# Patient Record
Sex: Female | Born: 1950 | Race: White | Hispanic: No | Smoking: Never smoker
Health system: Southern US, Community
[De-identification: ages and names within clinical notes are randomized; demographics above are authoritative.]

## PROBLEM LIST (undated history)

## (undated) DIAGNOSIS — M1990 Unspecified osteoarthritis, unspecified site: Secondary | ICD-10-CM

## (undated) DIAGNOSIS — E785 Hyperlipidemia, unspecified: Secondary | ICD-10-CM

## (undated) DIAGNOSIS — I10 Essential (primary) hypertension: Secondary | ICD-10-CM

## (undated) DIAGNOSIS — G8929 Other chronic pain: Secondary | ICD-10-CM

## (undated) DIAGNOSIS — K219 Gastro-esophageal reflux disease without esophagitis: Secondary | ICD-10-CM

## (undated) DIAGNOSIS — I219 Acute myocardial infarction, unspecified: Secondary | ICD-10-CM

## (undated) DIAGNOSIS — M519 Unspecified thoracic, thoracolumbar and lumbosacral intervertebral disc disorder: Secondary | ICD-10-CM

## (undated) HISTORY — PX: CARPAL TUNNEL RELEASE: SHX101

## (undated) HISTORY — DX: Hyperlipidemia, unspecified: E78.5

## (undated) HISTORY — PX: ABDOMINAL HYSTERECTOMY: SHX81

## (undated) HISTORY — PX: SHOULDER SURGERY: SHX246

## (undated) HISTORY — PX: ELBOW ARTHROPLASTY: SHX928

## (undated) HISTORY — PX: OTHER SURGICAL HISTORY: SHX169

---

## 2002-04-15 ENCOUNTER — Ambulatory Visit (HOSPITAL_BASED_OUTPATIENT_CLINIC_OR_DEPARTMENT_OTHER): Admission: RE | Admit: 2002-04-15 | Discharge: 2002-04-15 | Payer: Self-pay | Admitting: *Deleted

## 2014-06-29 DIAGNOSIS — K219 Gastro-esophageal reflux disease without esophagitis: Secondary | ICD-10-CM | POA: Insufficient documentation

## 2014-06-29 DIAGNOSIS — M419 Scoliosis, unspecified: Secondary | ICD-10-CM | POA: Insufficient documentation

## 2014-07-15 ENCOUNTER — Ambulatory Visit: Payer: Self-pay | Admitting: Neurology

## 2015-10-30 DIAGNOSIS — G894 Chronic pain syndrome: Secondary | ICD-10-CM | POA: Insufficient documentation

## 2015-10-30 DIAGNOSIS — J189 Pneumonia, unspecified organism: Secondary | ICD-10-CM | POA: Insufficient documentation

## 2015-10-30 DIAGNOSIS — I219 Acute myocardial infarction, unspecified: Secondary | ICD-10-CM | POA: Insufficient documentation

## 2015-11-10 ENCOUNTER — Encounter (HOSPITAL_COMMUNITY): Payer: Self-pay | Admitting: Emergency Medicine

## 2015-11-10 ENCOUNTER — Emergency Department (HOSPITAL_COMMUNITY)
Admission: EM | Admit: 2015-11-10 | Discharge: 2015-11-10 | Disposition: A | Discharge status: Home / Self Care | Payer: Medicare HMO | Attending: Emergency Medicine | Admitting: Emergency Medicine

## 2015-11-10 ENCOUNTER — Emergency Department (HOSPITAL_COMMUNITY): Payer: Medicare HMO

## 2015-11-10 DIAGNOSIS — G8929 Other chronic pain: Secondary | ICD-10-CM | POA: Insufficient documentation

## 2015-11-10 DIAGNOSIS — R350 Frequency of micturition: Secondary | ICD-10-CM | POA: Insufficient documentation

## 2015-11-10 DIAGNOSIS — Z79899 Other long term (current) drug therapy: Secondary | ICD-10-CM | POA: Diagnosis not present

## 2015-11-10 DIAGNOSIS — R1084 Generalized abdominal pain: Secondary | ICD-10-CM | POA: Diagnosis not present

## 2015-11-10 DIAGNOSIS — Z87448 Personal history of other diseases of urinary system: Secondary | ICD-10-CM | POA: Diagnosis not present

## 2015-11-10 DIAGNOSIS — R197 Diarrhea, unspecified: Secondary | ICD-10-CM

## 2015-11-10 DIAGNOSIS — I10 Essential (primary) hypertension: Secondary | ICD-10-CM | POA: Diagnosis not present

## 2015-11-10 DIAGNOSIS — Z8701 Personal history of pneumonia (recurrent): Secondary | ICD-10-CM | POA: Diagnosis not present

## 2015-11-10 DIAGNOSIS — Z792 Long term (current) use of antibiotics: Secondary | ICD-10-CM | POA: Diagnosis not present

## 2015-11-10 DIAGNOSIS — I252 Old myocardial infarction: Secondary | ICD-10-CM | POA: Diagnosis not present

## 2015-11-10 DIAGNOSIS — R1030 Lower abdominal pain, unspecified: Secondary | ICD-10-CM

## 2015-11-10 DIAGNOSIS — E876 Hypokalemia: Secondary | ICD-10-CM | POA: Insufficient documentation

## 2015-11-10 HISTORY — DX: Other chronic pain: G89.29

## 2015-11-10 HISTORY — DX: Acute myocardial infarction, unspecified: I21.9

## 2015-11-10 HISTORY — DX: Essential (primary) hypertension: I10

## 2015-11-10 LAB — URINALYSIS, ROUTINE W REFLEX MICROSCOPIC
BILIRUBIN URINE: NEGATIVE
GLUCOSE, UA: NEGATIVE mg/dL
HGB URINE DIPSTICK: NEGATIVE
Ketones, ur: NEGATIVE mg/dL
Leukocytes, UA: NEGATIVE
Nitrite: NEGATIVE
Protein, ur: NEGATIVE mg/dL
SPECIFIC GRAVITY, URINE: 1.025 (ref 1.005–1.030)
pH: 6.5 (ref 5.0–8.0)

## 2015-11-10 LAB — CBC WITH DIFFERENTIAL/PLATELET
BASOS ABS: 0 10*3/uL (ref 0.0–0.1)
BASOS PCT: 0 %
EOS ABS: 0.3 10*3/uL (ref 0.0–0.7)
EOS PCT: 3 %
HCT: 43.7 % (ref 36.0–46.0)
Hemoglobin: 14.4 g/dL (ref 12.0–15.0)
Lymphocytes Relative: 13 %
Lymphs Abs: 1.2 10*3/uL (ref 0.7–4.0)
MCH: 31.1 pg (ref 26.0–34.0)
MCHC: 33 g/dL (ref 30.0–36.0)
MCV: 94.4 fL (ref 78.0–100.0)
MONO ABS: 0.7 10*3/uL (ref 0.1–1.0)
Monocytes Relative: 7 %
NEUTROS ABS: 7.2 10*3/uL (ref 1.7–7.7)
Neutrophils Relative %: 77 %
Platelets: 349 10*3/uL (ref 150–400)
RBC: 4.63 MIL/uL (ref 3.87–5.11)
RDW: 14.3 % (ref 11.5–15.5)
WBC: 9.4 10*3/uL (ref 4.0–10.5)

## 2015-11-10 LAB — COMPREHENSIVE METABOLIC PANEL
ALBUMIN: 4 g/dL (ref 3.5–5.0)
ALK PHOS: 55 U/L (ref 38–126)
ALT: 80 U/L — ABNORMAL HIGH (ref 14–54)
AST: 71 U/L — ABNORMAL HIGH (ref 15–41)
Anion gap: 10 (ref 5–15)
BILIRUBIN TOTAL: 0.7 mg/dL (ref 0.3–1.2)
BUN: 16 mg/dL (ref 6–20)
CALCIUM: 9 mg/dL (ref 8.9–10.3)
CO2: 25 mmol/L (ref 22–32)
CREATININE: 0.73 mg/dL (ref 0.44–1.00)
Chloride: 102 mmol/L (ref 101–111)
GFR calc Af Amer: >60 mL/min (ref >60)
GFR calc non Af Amer: >60 mL/min (ref >60)
GLUCOSE: 117 mg/dL — AB (ref 65–99)
Potassium: 3 mmol/L — ABNORMAL LOW (ref 3.5–5.1)
Sodium: 137 mmol/L (ref 135–145)
TOTAL PROTEIN: 7 g/dL (ref 6.5–8.1)

## 2015-11-10 MED ORDER — IOHEXOL 300 MG/ML  SOLN
100.0000 mL | Freq: Once | INTRAMUSCULAR | Status: AC | PRN
  Administered 2015-11-10: 100 mL via INTRAVENOUS

## 2015-11-10 MED ORDER — SODIUM CHLORIDE 0.9 % IV BOLUS (SEPSIS)
1000.0000 mL | Freq: Once | INTRAVENOUS | Status: AC
  Administered 2015-11-10: 1000 mL via INTRAVENOUS

## 2015-11-10 MED ORDER — POTASSIUM CHLORIDE CRYS ER 20 MEQ PO TBCR
40.0000 meq | EXTENDED_RELEASE_TABLET | Freq: Once | ORAL | Status: AC
  Administered 2015-11-10: 40 meq via ORAL
  Filled 2015-11-10: qty 2

## 2015-11-10 MED ORDER — BARIUM SULFATE 2.1 % PO SUSP
ORAL | Status: DC
Start: 2015-11-10 — End: 2015-11-10
  Filled 2015-11-10: qty 1

## 2015-11-10 MED ORDER — DICYCLOMINE HCL 10 MG/ML IM SOLN
20.0000 mg | Freq: Once | INTRAMUSCULAR | Status: AC
  Administered 2015-11-10: 20 mg via INTRAMUSCULAR
  Filled 2015-11-10: qty 2

## 2015-11-10 MED ORDER — POTASSIUM CHLORIDE CRYS ER 20 MEQ PO TBCR: 20.0000 meq | EXTENDED_RELEASE_TABLET | Freq: Two times a day (BID) | ORAL | Status: DC

## 2015-11-10 NOTE — ED Notes (Signed)
Pt c/o diarrhea for the last 4 days, she states she is currently taking flagyl.

## 2015-11-10 NOTE — ED Notes (Signed)
Patient ambulatory to restroom to collect urine sample 

## 2015-11-10 NOTE — Discharge Instructions (Signed)
Drink plenty of fluids. Continue the bentyl for your abdominal pain 4 times a day if needed. Use the imodium for your diarrhea. Take the potassium pills until gone. Eat Yogurt to help resolve your diarrhea. Follow up with your doctor if you aren't improving in the next several days.   Return to the ED if you get a fever or feel you are getting dehydrated.

## 2015-11-10 NOTE — ED Notes (Signed)
Patient ambulated to restroom with staff assist, tolerated well.

## 2015-11-10 NOTE — ED Provider Notes (Signed)
CSN: [id number]     Arrival date & time 11/10/15  0155 History   First MD Initiated Contact with Patient 11/10/15 0207   Chief Complaint  Patient presents with  . Diarrhea     (Consider location/radiation/quality/duration/timing/severity/associated sxs/prior Treatment) HPI patient reports she remembers eating dinner Saturday night, November 5. She reports her brother found her unresponsive the following day. She was admitted to [facility] with fever to 105. She states she had lumbar puncture done twice to look for meningitis and she was diagnosed with an MI. She states she was transferred to [facility] where she was admitted for over a week. She reports she started having diarrhea in the hospital and reports she was discharged about 3 days ago. When asked how often she is having diarrhea she states "every little bit". When asked how may times a day that is she states 3-5. She states her stools are loose and watery. She reports some lower abdominal pain that has been constant. She states she was taking some diarrhea medication which she cannot name which helped however she states it restarted again tonight. She states she was feeling better when she was discharged from the hospital. She denies having any chest pain. She denies feeling dizzy or lightheaded. She also states the did not return her myrebetriq for her OAB when she was discharged from the hospital and she is having urinary frequency.    PCP Dr Sharee Pimple in [place]  Past Medical History  Diagnosis Date  . MI (myocardial infarction) ([place])   . Hypertension   . Chronic pain   OAB  Past Surgical History  Procedure Laterality Date  . Knee replacements  bilateral    . Shoulder surgery  Bilateral rotator cuff    . Carpal tunnel release left    hysterectomy, oopherectomy Left elbow tendonitis surgery  History reviewed. No pertinent family history. Social History  Substance Use Topics  . Smoking status: Never Smoker   .  Smokeless tobacco: None  . Alcohol Use: No   Lives at home Lives with brother  OB History    No data available     Review of Systems  All other systems reviewed and are negative.     Allergies  Codeine and Nsaids  Home Medications   Prior to Admission medications   Medication Sig Start Date End Date Taking? Authorizing Provider  acetaminophen (TYLENOL) 325 MG tablet Take 650 mg by mouth every 4 (four) hours as needed.   Yes Historical Provider, MD  cetirizine (ZYRTEC) 10 MG tablet Take 10 mg by mouth daily.   Yes Historical Provider, MD  diazepam (VALIUM) 2 MG tablet Take 2 mg by mouth every 6 (six) hours as needed for anxiety.   Yes Historical Provider, MD  dicyclomine (BENTYL) 10 MG capsule Take 10 mg by mouth 4 (four) times daily -  before meals and at bedtime.   Yes Historical Provider, MD  esomeprazole (NEXIUM) 40 MG capsule Take 40 mg by mouth daily at 12 noon.   Yes Historical Provider, MD  fentaNYL (DURAGESIC - DOSED MCG/HR) 50 MCG/HR Place 50 mcg onto the skin every 3 (three) days.   Yes Historical Provider, MD  gabapentin (NEURONTIN) 100 MG capsule Take 100 mg by mouth 3 (three) times daily.   Yes Historical Provider, MD  HYDROmorphone (DILAUDID) 2 MG tablet Take by mouth every 6 (six) hours as needed for severe pain.   Yes Historical Provider, MD  isosorbide dinitrate (ISORDIL) 10 MG tablet Take 10  mg by mouth 3 (three) times daily.   Yes Historical Provider, MD  levofloxacin (LEVAQUIN) 750 MG tablet Take 750 mg by mouth daily.   Yes Historical Provider, MD  lisinopril (PRINIVIL,ZESTRIL) 10 MG tablet Take 10 mg by mouth daily.   Yes Historical Provider, MD  loperamide (IMODIUM) 2 MG capsule Take 2 mg by mouth as needed for diarrhea or loose stools.   Yes Historical Provider, MD  metoprolol (LOPRESSOR) 50 MG tablet Take 50 mg by mouth 2 (two) times daily.   Yes Historical Provider, MD  metroNIDAZOLE (FLAGYL) 500 MG tablet Take 500 mg by mouth 3 (three) times daily.   Yes  Historical Provider, MD  modafinil (PROVIGIL) 100 MG tablet Take 100 mg by mouth daily.   Yes Historical Provider, MD  rosuvastatin (CRESTOR) 5 MG tablet Take 5 mg by mouth daily.   Yes Historical Provider, MD  sertraline (ZOLOFT) 50 MG tablet Take 50 mg by mouth daily.   Yes Historical Provider, MD  potassium chloride SA (K-DUR,KLOR-CON) 20 MEQ tablet Take 1 tablet (20 mEq total) by mouth 2 (two) times daily. 11/10/15   Rolland Porter, MD   BP 154/84 mmHg  Pulse 74  Temp(Src) 98.2 F (36.8 C) (Oral)  Resp 24  Ht 5' 6.5" (1.689 m)  Wt 250 lb (113.399 kg)  BMI 39.75 kg/m2  SpO2 94%  Vital signs normal   Physical Exam  Constitutional: She is oriented to person, place, and time. She appears well-developed and well-nourished.  Non-toxic appearance. She does not appear ill. No distress.  HENT:  Head: Normocephalic and atraumatic.  Right Ear: External ear normal.  Left Ear: External ear normal.  Nose: Nose normal. No mucosal edema or rhinorrhea.  Mouth/Throat: Oropharynx is clear and moist and mucous membranes are normal. No dental abscesses or uvula swelling.  Eyes: Conjunctivae and EOM are normal. Pupils are equal, round, and reactive to light.  Neck: Normal range of motion and full passive range of motion without pain. Neck supple.  Cardiovascular: Normal rate, regular rhythm and normal heart sounds.  Exam reveals no gallop and no friction rub.   No murmur heard. Pulmonary/Chest: Effort normal and breath sounds normal. No respiratory distress. She has no wheezes. She has no rhonchi. She has no rales. She exhibits no tenderness and no crepitus.  Abdominal: Soft. Normal appearance and bowel sounds are normal. She exhibits no distension. There is no tenderness. There is no rebound and no guarding.    Mild diffuse lower abdominal pain  Musculoskeletal: Normal range of motion. She exhibits no edema or tenderness.  Moves all extremities well.   Neurological: She is alert and oriented to  person, place, and time. She has normal strength. No cranial nerve deficit.  Skin: Skin is warm, dry and intact. No rash noted. No erythema. There is pallor.  Psychiatric: Her behavior is normal. Her speech is delayed.  Flat affect, poor eye contact  Nursing note and vitals reviewed.   ED Course  Procedures (including critical care time)  Medications  Barium Sulfate 2.1 % SUSP (not administered)  sodium chloride 0.9 % bolus 1,000 mL (0 mLs Intravenous Stopped 11/10/15 0356)  iohexol (OMNIPAQUE) 300 MG/ML solution 100 mL (100 mLs Intravenous Contrast Given 11/10/15 0336)  potassium chloride SA (K-DUR,KLOR-CON) CR tablet 40 mEq (40 mEq Oral Given 11/10/15 0355)  dicyclomine (BENTYL) injection 20 mg (20 mg Intramuscular Given 11/10/15 0419)   Patient was given IV fluids. CT scan was done to look for colitis or other intestinal  abnormalities that could cause diarrhea. After reviewing her laboratory results she was given oral potassium which she tolerated well. Nurse reports patients had no diarrhea since she's been in the ED.  At 4 AM I talked to the patient. I gave her her test results. She is disappointed she's not going to be admitted. She is asking me for sleeping pill. I asked her why she needs a sleeping pill and she states she can't sleep because of the diarrhea and the abdominal pain. She was given Bentyl IM and I discussed with her did not want to give her sleeping pill and have her be incontinent in her sleep from diarrhea or from urinary frequency.  Patient CT scan shows some inflammatory changes in her right lung which is consistent with her history of pneumonia. Patient is still on Levaquin for that.  04:47 Pt states the bentyl has helped her pain. I went over her medications with her daughter who is now taking care of the patient's medications. She is going to make sure she takes the bentyl and imodium as needed for pain or diarrhea. We discussed some of this may be from narcotic  withdrawal since her narcotics have been reduced since she left the hospital and they acknowledge they are aware of that.  We also discussed taking the potassium pills. Daughter states in the hospital she had had a low potassium that was resolved with IV potassium at time of discharge it was cut to normal again. We discussed it is probably low again because of the persistent diarrhea.   I reviewed patient's [facility] records through care everywhere. According to their notes they surmise patient had taken too much of her pain medication and was found unresponsive. She was admitted to [facility] with sepsis and pneumonia felt to be aspiration pneumonia. She was on a ventilator and was transferred to [facility]. She was extubated successfully there. She had a NSTEMI.  She did report diarrhea in the hospital. Her C. difficile test was negative. She had a normal cardiac catheterization. She had an echocardiogram which showed mild left ventricular systolic dysfunction with mild LVH, normal right ventricular systolic function, trivial regurgitation noted, no valvular stenosis. She had Doppler ultrasounds done of her legs which showed no DVT. Her Blood cultures at [facility] were negative. Her home pain medications were decreased at time of discharge.  Labs Review Results for orders placed or performed during the hospital encounter of 11/10/15  Comprehensive metabolic panel  Result Value Ref Range   Sodium 137 135 - 145 mmol/L   Potassium 3.0 (L) 3.5 - 5.1 mmol/L   Chloride 102 101 - 111 mmol/L   CO2 25 22 - 32 mmol/L   Glucose, Bld 117 (H) 65 - 99 mg/dL   BUN 16 6 - 20 mg/dL   Creatinine, Ser 0.73 0.44 - 1.00 mg/dL   Calcium 9.0 8.9 - 10.3 mg/dL   Total Protein 7.0 6.5 - 8.1 g/dL   Albumin 4.0 3.5 - 5.0 g/dL   AST 71 (H) 15 - 41 U/L   ALT 80 (H) 14 - 54 U/L   Alkaline Phosphatase 55 38 - 126 U/L   Total Bilirubin 0.7 0.3 - 1.2 mg/dL   GFR calc non Af Amer >60 >60 mL/min   GFR calc Af Amer >60 >60  mL/min   Anion gap 10 5 - 15  CBC with Differential  Result Value Ref Range   WBC 9.4 4.0 - 10.5 K/uL   RBC 4.63 3.87 -  5.11 MIL/uL   Hemoglobin 14.4 12.0 - 15.0 g/dL   HCT 43.7 36.0 - 46.0 %   MCV 94.4 78.0 - 100.0 fL   MCH 31.1 26.0 - 34.0 pg   MCHC 33.0 30.0 - 36.0 g/dL   RDW 14.3 11.5 - 15.5 %   Platelets 349 150 - 400 K/uL   Neutrophils Relative % 77 %   Neutro Abs 7.2 1.7 - 7.7 K/uL   Lymphocytes Relative 13 %   Lymphs Abs 1.2 0.7 - 4.0 K/uL   Monocytes Relative 7 %   Monocytes Absolute 0.7 0.1 - 1.0 K/uL   Eosinophils Relative 3 %   Eosinophils Absolute 0.3 0.0 - 0.7 K/uL   Basophils Relative 0 %   Basophils Absolute 0.0 0.0 - 0.1 K/uL  Urinalysis, Routine w reflex microscopic  Result Value Ref Range   Color, Urine AMBER (A) YELLOW   APPearance CLEAR CLEAR   Specific Gravity, Urine 1.025 1.005 - 1.030   pH 6.5 5.0 - 8.0   Glucose, UA NEGATIVE NEGATIVE mg/dL   Hgb urine dipstick NEGATIVE NEGATIVE   Bilirubin Urine NEGATIVE NEGATIVE   Ketones, ur NEGATIVE NEGATIVE mg/dL   Protein, ur NEGATIVE NEGATIVE mg/dL   Nitrite NEGATIVE NEGATIVE   Leukocytes, UA NEGATIVE NEGATIVE    Laboratory interpretation all normal except hypokalemia     Imaging Review Ct Abdomen Pelvis W Contrast  11/10/2015  CLINICAL DATA:  Lower abdominal pain, diarrhea for 4 days. On several antibiotics. History of hypertension and chronic pain. EXAM: CT ABDOMEN AND PELVIS WITH CONTRAST TECHNIQUE: Multidetector CT imaging of the abdomen and pelvis was performed using the standard protocol following bolus administration of intravenous contrast. CONTRAST:  146mL OMNIPAQUE IOHEXOL 300 MG/ML  SOLN COMPARISON:  Abdominal ultrasound May 08, 2012 FINDINGS: LUNG BASES: RIGHT lower lobe strandy densities, a 10 mm nodular density, axial 4/31. Mild dependent atelectasis LEFT lung. Included heart size is normal. No pericardial effusions. SOLID ORGANS: The liver is diffusely hypodense compatible with steatosis.  Subcentimeter probable cyst versus lymphangioma in the spleen. Gallbladder, pancreas and adrenal glands are unremarkable. GASTROINTESTINAL TRACT: The stomach, small and large bowel are normal in course and caliber without inflammatory changes. The appendix is not discretely identified, however there are no inflammatory changes in the right lower quadrant. KIDNEYS/ URINARY TRACT: Kidneys are orthotopic, demonstrating symmetric enhancement. No nephrolithiasis, hydronephrosis or solid renal masses. Bilateral extrarenal pelvises. 22 mm cyst RIGHT interpolar kidney. The unopacified ureters are normal in course and caliber. Delayed imaging through the kidneys demonstrates symmetric prompt contrast excretion within the proximal urinary collecting system. Urinary bladder is partially distended and unremarkable. PERITONEUM/RETROPERITONEUM: Aortoiliac vessels are normal in course and caliber, mild calcific atherosclerosis. No lymphadenopathy by CT size criteria. Status post hysterectomy. No intraperitoneal free fluid nor free air. SOFT TISSUE/OSSEOUS STRUCTURES: Non-suspicious. Mild nonspecific fat stranding RIGHT inguinal canal. Broad lumbar levoscoliosis. Degenerative change lumbar spine resulting in severe LEFT L5-S1 neural foraminal narrowing. Severe RIGHT L4-5 neural foraminal narrowing. Anterior abdominal wall scarring. IMPRESSION: No acute intra-abdominal or pelvic process. 10 mm nodular density RIGHT lower lobe associated strandy densities may be inflammatory though, recommend follow-up CT in 3 months or PET/CT or Biopsy for further evaluation. This recommendation follows the consensus statement: "Guidelines for Management of Small Pulmonary Nodules Detected on CT Scans: A Statement from the [place]" as published in Radiology 2005; 237:395-400. Hepatic steatosis. Electronically Signed   By: Elon Alas M.D.   On: 11/10/2015 04:00   I have personally reviewed and evaluated  these images and lab  results as part of my medical decision-making.   EKG Interpretation None      MDM   Final diagnoses:  Diarrhea, unspecified type  Abdominal pain, lower  Hypokalemia    New Prescriptions   POTASSIUM CHLORIDE SA (K-DUR,KLOR-CON) 20 MEQ TABLET    Take 1 tablet (20 mEq total) by mouth 2 (two) times daily.    Plan discharge  Rolland Porter, MD, Barbette Or, MD 11/10/15 [id number]

## 2015-11-10 NOTE — ED Notes (Signed)
Patient verbalizes understanding of discharge instructions, prescription medications, home care and follow up care. Patient out of department at this time with family. 

## 2015-11-10 NOTE — ED Notes (Signed)
Patient can't tolerate contrast after taking in half the bottle. Sick on stomach.

## 2015-11-13 DIAGNOSIS — N3281 Overactive bladder: Secondary | ICD-10-CM | POA: Insufficient documentation

## 2015-11-13 DIAGNOSIS — F32A Depression, unspecified: Secondary | ICD-10-CM | POA: Insufficient documentation

## 2015-11-13 DIAGNOSIS — R197 Diarrhea, unspecified: Secondary | ICD-10-CM | POA: Insufficient documentation

## 2015-11-13 DIAGNOSIS — F329 Major depressive disorder, single episode, unspecified: Secondary | ICD-10-CM | POA: Insufficient documentation

## 2015-11-13 DIAGNOSIS — I10 Essential (primary) hypertension: Secondary | ICD-10-CM | POA: Insufficient documentation

## 2015-11-13 DIAGNOSIS — R1084 Generalized abdominal pain: Secondary | ICD-10-CM | POA: Insufficient documentation

## 2015-11-18 DIAGNOSIS — R911 Solitary pulmonary nodule: Secondary | ICD-10-CM | POA: Insufficient documentation

## 2015-12-12 ENCOUNTER — Telehealth: Payer: Self-pay | Admitting: Gastroenterology

## 2015-12-12 ENCOUNTER — Encounter: Payer: Self-pay | Admitting: Gastroenterology

## 2015-12-12 ENCOUNTER — Ambulatory Visit (INDEPENDENT_AMBULATORY_CARE_PROVIDER_SITE_OTHER): Payer: Medicare HMO | Admitting: Gastroenterology

## 2015-12-12 VITALS — BP 179/98 | HR 68 | Temp 99.0°F | Ht 66.5 in | Wt 211.0 lb

## 2015-12-12 DIAGNOSIS — K5900 Constipation, unspecified: Secondary | ICD-10-CM

## 2015-12-12 DIAGNOSIS — G5600 Carpal tunnel syndrome, unspecified upper limb: Secondary | ICD-10-CM | POA: Insufficient documentation

## 2015-12-12 DIAGNOSIS — R7303 Prediabetes: Secondary | ICD-10-CM | POA: Insufficient documentation

## 2015-12-12 DIAGNOSIS — M1990 Unspecified osteoarthritis, unspecified site: Secondary | ICD-10-CM | POA: Insufficient documentation

## 2015-12-12 NOTE — Telephone Encounter (Signed)
Pt has scheduled an appt for today (12/12/15) due to a cancellation.

## 2015-12-12 NOTE — Telephone Encounter (Signed)
Patients daughter called, Amy, to look for a cancellation for her mother. Her current appointment is 12/29. She stated her mother has not had a bowel movement in 2 weeks and is hurting. Please call her and advise. Cell  [phone] or home [phone]

## 2015-12-12 NOTE — Progress Notes (Signed)
Gastroenterology Consultation  Referring Provider:     Vesta Mixer Primary Care Physician:  Vesta Mixer Primary Gastroenterologist:  Dr. Allen Norris     Reason for Consultation:     Constipation        HPI:   Tina Morgan is a 64 y.o. y/o female referred for consultation & management of constipation by Dr. Sharee Pimple, Lennon Alstrom, PA-C.  This patient comes today with consultation. The patient states she is been constant for some time. The patient is also on pain killers at the present time. The patient was seen in the hospital for lower abdominal pain and previous to that she was seen for loss of consciousness and was found to have an MI. The patient states she takes MiraLAX intermittently and takes a laxative every day. There is no report of any unexplained weight loss fevers chills nausea vomiting black stools or bloody stools. The patient states that her abdominal pain is in the lower abdomen and results in her having to double over in pain. There is no report of this pain happening prior to her previous episodes. The patient reports that she had a colonoscopy many years ago but nothing since.  Past Medical History  Diagnosis Date  . MI (myocardial infarction) ([place])   . Hypertension   . Chronic pain   . Hyperlipidemia     Past Surgical History  Procedure Laterality Date  . Knee replacements    . Shoulder surgery    . Carpal tunnel release      Prior to Admission medications   Medication Sig Start Date End Date Taking? Authorizing Provider  acetaminophen (TYLENOL) 325 MG tablet Take 650 mg by mouth every 4 (four) hours as needed.   Yes Historical Provider, MD  cetirizine (ZYRTEC) 10 MG tablet Take 10 mg by mouth daily.   Yes Historical Provider, MD  DULoxetine (CYMBALTA) 60 MG capsule Take by mouth. 11/18/15 11/17/16 Yes Historical Provider, MD  esomeprazole (NEXIUM) 40 MG capsule Take 40 mg by mouth daily at 12 noon.   Yes Historical Provider, MD  fentaNYL  (DURAGESIC - DOSED MCG/HR) 100 MCG/HR  09/23/15  Yes Historical Provider, MD  fentaNYL (DURAGESIC - DOSED MCG/HR) 50 MCG/HR Place 50 mcg onto the skin every 3 (three) days.   Yes Historical Provider, MD  gabapentin (NEURONTIN) 100 MG capsule Take 100 mg by mouth 3 (three) times daily.   Yes Historical Provider, MD  HYDROmorphone (DILAUDID) 2 MG tablet Take by mouth every 6 (six) hours as needed for severe pain.   Yes Historical Provider, MD  HYDROmorphone (DILAUDID) 4 MG tablet  11/30/15  Yes Historical Provider, MD  isosorbide dinitrate (ISORDIL) 10 MG tablet Take 10 mg by mouth 3 (three) times daily.   Yes Historical Provider, MD  lisinopril (PRINIVIL,ZESTRIL) 10 MG tablet Take 10 mg by mouth daily.   Yes Historical Provider, MD  metoprolol (LOPRESSOR) 50 MG tablet Take 50 mg by mouth 2 (two) times daily.   Yes Historical Provider, MD  mirabegron ER (MYRBETRIQ) 50 MG TB24 tablet Take by mouth.   Yes Historical Provider, MD  rosuvastatin (CRESTOR) 5 MG tablet Take 5 mg by mouth daily.   Yes Historical Provider, MD  cloNIDine (CATAPRES) 0.2 MG tablet Reported on 12/12/2015 10/17/15   Historical Provider, MD  diazepam (VALIUM) 2 MG tablet Take 2 mg by mouth every 6 (six) hours as needed for anxiety. Reported on 12/12/2015    Historical Provider, MD  diazepam (VALIUM)  5 MG tablet Reported on 12/12/2015 10/21/15   Historical Provider, MD  dicyclomine (BENTYL) 10 MG capsule Take 10 mg by mouth 4 (four) times daily -  before meals and at bedtime. Reported on 12/12/2015    Historical Provider, MD  gabapentin (NEURONTIN) 600 MG tablet Reported on 12/12/2015 10/17/15   Historical Provider, MD  levofloxacin (LEVAQUIN) 750 MG tablet Take 750 mg by mouth daily. Reported on 12/12/2015    Historical Provider, MD  loperamide (IMODIUM) 2 MG capsule Take 2 mg by mouth as needed for diarrhea or loose stools. Reported on 12/12/2015    Historical Provider, MD  metroNIDAZOLE (FLAGYL) 500 MG tablet Take 500 mg by mouth 3  (three) times daily. Reported on 12/12/2015    Historical Provider, MD  modafinil (PROVIGIL) 100 MG tablet Take 100 mg by mouth daily. Reported on 12/12/2015    Historical Provider, MD  potassium chloride SA (K-DUR,KLOR-CON) 20 MEQ tablet Take 1 tablet (20 mEq total) by mouth 2 (two) times daily. Patient not taking: Reported on 12/12/2015 11/10/15   Rolland Porter, MD  QUEtiapine (SEROQUEL) 50 MG tablet Reported on 12/12/2015 10/17/15   Historical Provider, MD  sertraline (ZOLOFT) 50 MG tablet Take 50 mg by mouth daily. Reported on 12/12/2015    Historical Provider, MD  zolpidem Lorrin Mais) 10 MG tablet Reported on 12/12/2015 10/17/15   Historical Provider, MD    No family history on file.   Social History  Substance Use Topics  . Smoking status: Never Smoker   . Smokeless tobacco: Not on file  . Alcohol Use: No    Allergies as of 12/12/2015 - Review Complete 12/12/2015  Allergen Reaction Noted  . Codeine Itching 11/10/2015  . Morphine and related Itching 12/12/2015  . Nsaids  11/10/2015    Review of Systems:    All systems reviewed and negative except where noted in HPI.   Physical Exam:  BP 179/98 mmHg  Pulse 68  Temp(Src) 99 F (37.2 C) (Oral)  Ht 5' 6.5" (1.689 m)  Wt 211 lb (95.709 kg)  BMI 33.55 kg/m2 No LMP recorded. Patient is postmenopausal. Psych:  Alert and cooperative. Normal mood and affect. General:   Alert,  Well-developed, well-nourished, pleasant and cooperative in NAD Head:  Normocephalic and atraumatic. Eyes:  Sclera clear, no icterus.   Conjunctiva pink. Ears:  Normal auditory acuity. Nose:  No deformity, discharge, or lesions. Mouth:  No deformity or lesions,oropharynx pink & moist. Neck:  Supple; no masses or thyromegaly. Lungs:  Respirations even and unlabored.  Clear throughout to auscultation.   No wheezes, crackles, or rhonchi. No acute distress. Heart:  Regular rate and rhythm; no murmurs, clicks, rubs, or gallops. Abdomen:  Normal bowel sounds.  No  bruits.  Soft, non-tender and non-distended without masses, hepatosplenomegaly or hernias noted.  No guarding or rebound tenderness.  Negative Carnett sign.   Rectal:  Deferred.  Msk:  Symmetrical without gross deformities.  Good, equal movement & strength bilaterally. Pulses:  Normal pulses noted. Extremities:  No clubbing or edema.  No cyanosis. Neurologic:  Alert and oriented x3;  grossly normal neurologically. Skin:  Intact without significant lesions or rashes.  No jaundice. Lymph Nodes:  No significant cervical adenopathy. Psych:  Alert and cooperative. Normal mood and affect.  Imaging Studies: No results found.  Assessment and Plan:   Tina Morgan is a 64 y.o. y/o female comes in today with a history of chronic constipation that has been worse recently. The patient hasn't had a colonoscopy in  over 15 years. The patient will need a colonoscopy within the next month or so. The patient needs to have her going to the bathroom more regularly he 4 setting her up for a colonoscopy so that she can take the prep. The patient will be started on a trial of Linzess 290 mg once a day. She will also continue the MiraLAX. The patient has been explained the plan and agrees with it.   Note: This dictation was prepared with Dragon dictation along with smaller phrase technology. Any transcriptional errors that result from this process are unintentional.

## 2015-12-22 ENCOUNTER — Ambulatory Visit: Payer: Self-pay | Admitting: Gastroenterology

## 2016-01-04 ENCOUNTER — Other Ambulatory Visit: Payer: Self-pay

## 2016-01-04 ENCOUNTER — Telehealth: Payer: Self-pay | Admitting: Gastroenterology

## 2016-01-04 NOTE — Telephone Encounter (Signed)
Patient has been doing well on the linzess and would like a script called into [facility] in [place] . And i have scheduled patient for a colonoscopy. when the linzess is called in can you also call in patients prep.

## 2016-01-05 ENCOUNTER — Other Ambulatory Visit: Payer: Self-pay

## 2016-01-05 DIAGNOSIS — K5900 Constipation, unspecified: Secondary | ICD-10-CM

## 2016-01-05 DIAGNOSIS — Z1211 Encounter for screening for malignant neoplasm of colon: Secondary | ICD-10-CM

## 2016-01-05 MED ORDER — LINACLOTIDE 290 MCG PO CAPS: 290.0000 ug | ORAL_CAPSULE | Freq: Every day | ORAL | Status: AC

## 2016-01-05 MED ORDER — PEG 3350-KCL-NA BICARB-NACL 420 G PO SOLR: 4000.0000 mL | ORAL | Status: DC

## 2016-01-05 NOTE — Telephone Encounter (Signed)
Rx for Linzess and bowel prep called into [facility] in [place] per pt's request.

## 2016-01-19 ENCOUNTER — Telehealth: Payer: Self-pay | Admitting: Gastroenterology

## 2016-01-19 NOTE — Telephone Encounter (Signed)
No authorization is required for CPT: [id number] per Verdis Frederickson with [facility]. Reference number: [id number].

## 2016-01-26 NOTE — Discharge Instructions (Signed)

## 2016-01-30 ENCOUNTER — Ambulatory Visit
Admission: RE | Admit: 2016-01-30 | Discharge: 2016-01-30 | Disposition: A | Discharge status: Home / Self Care | Payer: Medicare HMO | Source: Ambulatory Visit | Attending: Gastroenterology | Admitting: Gastroenterology

## 2016-01-30 ENCOUNTER — Ambulatory Visit: Payer: Medicare HMO | Admitting: Anesthesiology

## 2016-01-30 ENCOUNTER — Other Ambulatory Visit: Payer: Self-pay | Admitting: Gastroenterology

## 2016-01-30 ENCOUNTER — Encounter
Admission: RE | Disposition: A | Discharge status: Home / Self Care | Payer: Self-pay | Source: Ambulatory Visit | Attending: Gastroenterology

## 2016-01-30 DIAGNOSIS — I252 Old myocardial infarction: Secondary | ICD-10-CM | POA: Insufficient documentation

## 2016-01-30 DIAGNOSIS — D123 Benign neoplasm of transverse colon: Secondary | ICD-10-CM | POA: Diagnosis not present

## 2016-01-30 DIAGNOSIS — K640 First degree hemorrhoids: Secondary | ICD-10-CM | POA: Diagnosis not present

## 2016-01-30 DIAGNOSIS — Z09 Encounter for follow-up examination after completed treatment for conditions other than malignant neoplasm: Secondary | ICD-10-CM | POA: Diagnosis not present

## 2016-01-30 DIAGNOSIS — E785 Hyperlipidemia, unspecified: Secondary | ICD-10-CM | POA: Diagnosis not present

## 2016-01-30 DIAGNOSIS — Z90710 Acquired absence of both cervix and uterus: Secondary | ICD-10-CM | POA: Insufficient documentation

## 2016-01-30 DIAGNOSIS — Z888 Allergy status to other drugs, medicaments and biological substances status: Secondary | ICD-10-CM | POA: Insufficient documentation

## 2016-01-30 DIAGNOSIS — Z79899 Other long term (current) drug therapy: Secondary | ICD-10-CM | POA: Diagnosis not present

## 2016-01-30 DIAGNOSIS — Z885 Allergy status to narcotic agent status: Secondary | ICD-10-CM | POA: Diagnosis not present

## 2016-01-30 DIAGNOSIS — Z1211 Encounter for screening for malignant neoplasm of colon: Secondary | ICD-10-CM | POA: Diagnosis not present

## 2016-01-30 DIAGNOSIS — Z96653 Presence of artificial knee joint, bilateral: Secondary | ICD-10-CM | POA: Insufficient documentation

## 2016-01-30 DIAGNOSIS — K219 Gastro-esophageal reflux disease without esophagitis: Secondary | ICD-10-CM | POA: Diagnosis not present

## 2016-01-30 DIAGNOSIS — Z6833 Body mass index (BMI) 33.0-33.9, adult: Secondary | ICD-10-CM | POA: Insufficient documentation

## 2016-01-30 DIAGNOSIS — G8929 Other chronic pain: Secondary | ICD-10-CM | POA: Diagnosis not present

## 2016-01-30 DIAGNOSIS — I10 Essential (primary) hypertension: Secondary | ICD-10-CM | POA: Insufficient documentation

## 2016-01-30 DIAGNOSIS — D124 Benign neoplasm of descending colon: Secondary | ICD-10-CM | POA: Insufficient documentation

## 2016-01-30 DIAGNOSIS — M1990 Unspecified osteoarthritis, unspecified site: Secondary | ICD-10-CM | POA: Insufficient documentation

## 2016-01-30 HISTORY — PX: COLONOSCOPY WITH PROPOFOL: SHX5780

## 2016-01-30 HISTORY — DX: Unspecified osteoarthritis, unspecified site: M19.90

## 2016-01-30 HISTORY — DX: Unspecified thoracic, thoracolumbar and lumbosacral intervertebral disc disorder: M51.9

## 2016-01-30 HISTORY — DX: Gastro-esophageal reflux disease without esophagitis: K21.9

## 2016-01-30 HISTORY — PX: POLYPECTOMY: SHX5525

## 2016-01-30 SURGERY — COLONOSCOPY WITH PROPOFOL
Anesthesia: Monitor Anesthesia Care

## 2016-01-30 MED ORDER — LACTATED RINGERS IV SOLN
INTRAVENOUS | Status: DC
  Administered 2016-01-30: 08:00:00 via INTRAVENOUS

## 2016-01-30 MED ORDER — PROPOFOL 10 MG/ML IV BOLUS
INTRAVENOUS | Status: DC | PRN
  Administered 2016-01-30 (×8): 20 mg via INTRAVENOUS

## 2016-01-30 MED ORDER — STERILE WATER FOR IRRIGATION IR SOLN
Status: DC | PRN
  Administered 2016-01-30: 09:00:00

## 2016-01-30 MED ORDER — ACETAMINOPHEN 325 MG PO TABS: 325.0000 mg | ORAL_TABLET | ORAL | Status: DC | PRN

## 2016-01-30 MED ORDER — ACETAMINOPHEN 160 MG/5ML PO SOLN: 325.0000 mg | ORAL | Status: DC | PRN

## 2016-01-30 SURGICAL SUPPLY — 28 items
CANISTER SUCT 1200ML W/VALVE (MISCELLANEOUS) ×4 IMPLANT
FCP ESCP3.2XJMB 240X2.8X (MISCELLANEOUS)
FORCEPS BIOP RAD 4 LRG CAP 4 (CUTTING FORCEPS) ×4 IMPLANT
FORCEPS BIOP RJ4 240 W/NDL (MISCELLANEOUS)
FORCEPS ESCP3.2XJMB 240X2.8X (MISCELLANEOUS) IMPLANT
GOWN CVR UNV OPN BCK APRN NK (MISCELLANEOUS) ×4 IMPLANT
GOWN ISOL THUMB LOOP REG UNIV (MISCELLANEOUS) ×4
HEMOCLIP INSTINCT (CLIP) IMPLANT
INJECTOR VARIJECT VIN23 (MISCELLANEOUS) IMPLANT
KIT CO2 TUBING (TUBING) IMPLANT
KIT DEFENDO VALVE AND CONN (KITS) IMPLANT
KIT ENDO PROCEDURE OLY (KITS) ×4 IMPLANT
LIGATOR MULTIBAND 6SHOOTER MBL (MISCELLANEOUS) IMPLANT
MARKER SPOT ENDO TATTOO 5ML (MISCELLANEOUS) IMPLANT
PAD GROUND ADULT SPLIT (MISCELLANEOUS) IMPLANT
SNARE SHORT THROW 13M SML OVAL (MISCELLANEOUS) ×4 IMPLANT
SNARE SHORT THROW 30M LRG OVAL (MISCELLANEOUS) IMPLANT
SPOT EX ENDOSCOPIC TATTOO (MISCELLANEOUS)
SUCTION POLY TRAP 4CHAMBER (MISCELLANEOUS) IMPLANT
TRAP SUCTION POLY (MISCELLANEOUS) ×4 IMPLANT
TUBING CONN 6MMX3.1M (TUBING)
TUBING SUCTION CONN 0.25 STRL (TUBING) IMPLANT
UNDERPAD 30X60 958B10 (PK) (MISCELLANEOUS) IMPLANT
VALVE BIOPSY ENDO (VALVE) IMPLANT
VARIJECT INJECTOR VIN23 (MISCELLANEOUS)
WATER AUXILLARY (MISCELLANEOUS) IMPLANT
WATER STERILE IRR 250ML POUR (IV SOLUTION) ×4 IMPLANT
WATER STERILE IRR 500ML POUR (IV SOLUTION) IMPLANT

## 2016-01-30 NOTE — Anesthesia Preprocedure Evaluation (Signed)
Anesthesia Evaluation  Patient identified by MRN, date of birth, ID band  Reviewed: Allergy & Precautions, H&P , NPO status , Patient's Chart, lab work & pertinent test results  Airway Mallampati: II  TM Distance: >3 FB Neck ROM: full    Dental no notable dental hx.    Pulmonary    Pulmonary exam normal        Cardiovascular hypertension, + Past MI   Rhythm:regular Rate:Normal     Neuro/Psych PSYCHIATRIC DISORDERS    GI/Hepatic GERD  ,  Endo/Other  Morbid obesity  Renal/GU      Musculoskeletal   Abdominal   Peds  Hematology   Anesthesia Other Findings   Reproductive/Obstetrics                             Anesthesia Physical Anesthesia Plan  ASA: III  Anesthesia Plan: MAC   Post-op Pain Management:    Induction:   Airway Management Planned:   Additional Equipment:   Intra-op Plan:   Post-operative Plan:   Informed Consent: I have reviewed the patients History and Physical, chart, labs and discussed the procedure including the risks, benefits and alternatives for the proposed anesthesia with the patient or authorized representative who has indicated his/her understanding and acceptance.     Plan Discussed with: CRNA  Anesthesia Plan Comments:         Anesthesia Quick Evaluation

## 2016-01-30 NOTE — Transfer of Care (Signed)
Immediate Anesthesia Transfer of Care Note  Patient: Tina Morgan  Procedure(s) Performed: Procedure(s): COLONOSCOPY WITH PROPOFOL (N/A) POLYPECTOMY  Patient Location: PACU  Anesthesia Type: MAC  Level of Consciousness: awake, alert  and patient cooperative  Airway and Oxygen Therapy: Patient Spontanous Breathing and Patient connected to supplemental oxygen  Post-op Assessment: Post-op Vital signs reviewed, Patient's Cardiovascular Status Stable, Respiratory Function Stable, Patent Airway and No signs of Nausea or vomiting  Post-op Vital Signs: Reviewed and stable  Complications: No apparent anesthesia complications

## 2016-01-30 NOTE — Anesthesia Postprocedure Evaluation (Signed)
Anesthesia Post Note  Patient: Tina Morgan  Procedure(s) Performed: Procedure(s) (LRB): COLONOSCOPY WITH PROPOFOL (N/A) POLYPECTOMY  Patient location during evaluation: PACU Anesthesia Type: MAC Level of consciousness: awake and alert and oriented Pain management: satisfactory to patient Vital Signs Assessment: post-procedure vital signs reviewed and stable Respiratory status: spontaneous breathing, nonlabored ventilation and respiratory function stable Cardiovascular status: blood pressure returned to baseline and stable Postop Assessment: Adequate PO intake and No signs of nausea or vomiting Anesthetic complications: no    Raliegh Ip

## 2016-01-30 NOTE — H&P (Signed)
[facility]  [address] [place],  [address] Phone: [phone] Fax : [phone]  Primary Care Physician:  Vesta Mixer Primary Gastroenterologist:  Dr. Allen Norris  Pre-Procedure History & Physical: HPI:  Tina Morgan is a 65 y.o. female is here for a screening colonoscopy.   Past Medical History  Diagnosis Date  . Hypertension   . Chronic pain   . Hyperlipidemia   . GERD (gastroesophageal reflux disease)   . Arthritis     osteoarthritis  . Lumbar disc disease     herniated disc  . MI (myocardial infarction) ([place])     [facility], No stents placed November 2016    Past Surgical History  Procedure Laterality Date  . Knee replacements    . Shoulder surgery    . Carpal tunnel release    . Abdominal hysterectomy    . Elbow arthroplasty Left     Prior to Admission medications   Medication Sig Start Date End Date Taking? Authorizing Provider  acetaminophen (TYLENOL) 325 MG tablet Take 650 mg by mouth every 4 (four) hours as needed.   Yes Historical Provider, MD  cetirizine (ZYRTEC) 10 MG tablet Take 10 mg by mouth daily.   Yes Historical Provider, MD  cloNIDine (CATAPRES) 0.2 MG tablet Reported on 01/24/2016 10/17/15  Yes Historical Provider, MD  dicyclomine (BENTYL) 10 MG capsule Take 10 mg by mouth 4 (four) times daily -  before meals and at bedtime. Reported on 01/24/2016   Yes Historical Provider, MD  DULoxetine (CYMBALTA) 60 MG capsule Take 60 mg by mouth daily.  11/18/15 11/17/16 Yes Historical Provider, MD  esomeprazole (NEXIUM) 40 MG capsule Take 40 mg by mouth daily at 12 noon.   Yes Historical Provider, MD  fentaNYL (DURAGESIC - DOSED MCG/HR) 50 MCG/HR Place 50 mcg onto the skin every 3 (three) days.   Yes Historical Provider, MD  fluticasone (FLONASE) 50 MCG/ACT nasal spray as needed.  12/21/15  Yes Historical Provider, MD  gabapentin (NEURONTIN) 100 MG capsule Take 300 mg by mouth 4 (four) times daily.    Yes Historical Provider, MD   HYDROmorphone (DILAUDID) 2 MG tablet Take 4 mg by mouth every 6 (six) hours as needed for severe pain.    Yes Historical Provider, MD  isosorbide dinitrate (ISORDIL) 10 MG tablet Take 10 mg by mouth 3 (three) times daily.   Yes Historical Provider, MD  Linaclotide (LINZESS) 290 MCG CAPS capsule Take 1 capsule (290 mcg total) by mouth daily. 01/05/16  Yes Lucilla Lame, MD  lisinopril (PRINIVIL,ZESTRIL) 10 MG tablet Take 10 mg by mouth daily.   Yes Historical Provider, MD  metoprolol (LOPRESSOR) 50 MG tablet Take 50 mg by mouth 2 (two) times daily.   Yes Historical Provider, MD  mirabegron ER (MYRBETRIQ) 50 MG TB24 tablet Take by mouth.   Yes Historical Provider, MD  polyethylene glycol-electrolytes (TRILYTE) 420 g solution Take 4,000 mLs by mouth as directed. Drink one 8oz glass every 30 mins until stools are clear 01/05/16  Yes Lucilla Lame, MD  rosuvastatin (CRESTOR) 5 MG tablet Take 5 mg by mouth daily.   Yes Historical Provider, MD  diazepam (VALIUM) 2 MG tablet Take 2 mg by mouth every 6 (six) hours as needed for anxiety. Reported on 01/24/2016    Historical Provider, MD  diazepam (VALIUM) 5 MG tablet Reported on 01/24/2016 10/21/15   Historical Provider, MD  fentaNYL (DURAGESIC - DOSED MCG/HR) 100 MCG/HR Reported on 01/24/2016 09/23/15   Historical Provider, MD  gabapentin (NEURONTIN) 600 MG tablet Reported on 01/24/2016 10/17/15   Historical Provider, MD  HYDROmorphone (DILAUDID) 4 MG tablet Reported on 01/24/2016 11/30/15   Historical Provider, MD  levofloxacin (LEVAQUIN) 750 MG tablet Take 750 mg by mouth daily. Reported on 01/30/2016    Historical Provider, MD  loperamide (IMODIUM) 2 MG capsule Take 2 mg by mouth as needed for diarrhea or loose stools. Reported on 01/24/2016    Historical Provider, MD  metroNIDAZOLE (FLAGYL) 500 MG tablet Take 500 mg by mouth 3 (three) times daily. Reported on 01/24/2016    Historical Provider, MD  modafinil (PROVIGIL) 100 MG tablet Take 100 mg by mouth daily. Reported  on 01/24/2016    Historical Provider, MD  potassium chloride SA (K-DUR,KLOR-CON) 20 MEQ tablet Take 1 tablet (20 mEq total) by mouth 2 (two) times daily. Patient not taking: Reported on 12/12/2015 11/10/15   Rolland Porter, MD  QUEtiapine (SEROQUEL) 50 MG tablet Reported on 01/24/2016 10/17/15   Historical Provider, MD  sertraline (ZOLOFT) 50 MG tablet Take 50 mg by mouth daily. Reported on 01/24/2016    Historical Provider, MD  zolpidem (AMBIEN) 10 MG tablet Reported on 01/30/2016 10/17/15   Historical Provider, MD    Allergies as of 01/04/2016 - Review Complete 12/12/2015  Allergen Reaction Noted  . Codeine Itching 11/10/2015  . Morphine and related Itching 12/12/2015  . Nsaids  11/10/2015    History reviewed. No pertinent family history.  Social History   Social History  . Marital Status: Widowed    Spouse Name: N/A  . Number of Children: N/A  . Years of Education: N/A   Occupational History  . Not on file.   Social History Main Topics  . Smoking status: Never Smoker   . Smokeless tobacco: Not on file  . Alcohol Use: No  . Drug Use: No  . Sexual Activity: Not on file   Other Topics Concern  . Not on file   Social History Narrative    Review of Systems: See HPI, otherwise negative ROS  Physical Exam: BP 183/88 mmHg  Pulse 59  Temp(Src) 97.3 F (36.3 C) (Temporal)  Resp 16  Ht 5\' 5"  (1.651 m)  Wt 201 lb (91.173 kg)  BMI 33.45 kg/m2  SpO2 100% General:   Alert,  pleasant and cooperative in NAD Head:  Normocephalic and atraumatic. Neck:  Supple; no masses or thyromegaly. Lungs:  Clear throughout to auscultation.    Heart:  Regular rate and rhythm. Abdomen:  Soft, nontender and nondistended. Normal bowel sounds, without guarding, and without rebound.   Neurologic:  Alert and  oriented x4;  grossly normal neurologically.  Impression/Plan: Tina Morgan is now here to undergo a screening colonoscopy.  Risks, benefits, and alternatives regarding colonoscopy have  been reviewed with the patient.  Questions have been answered.  All parties agreeable.

## 2016-01-30 NOTE — Anesthesia Procedure Notes (Signed)
Procedure Name: MAC Performed by: Bay Wayson Pre-anesthesia Checklist: Patient identified, Emergency Drugs available, Suction available, Patient being monitored and Timeout performed Patient Re-evaluated:Patient Re-evaluated prior to inductionOxygen Delivery Method: Nasal cannula Preoxygenation: Pre-oxygenation with 100% oxygen     

## 2016-01-30 NOTE — Op Note (Signed)
[facility] Patient Name: Tina Morgan Procedure Date: 01/30/2016 8:41 AM MRN: [id number] Account #: [id number] Date of Birth: 1951-07-07 Admit Type: Outpatient Age: 65 Room: [facility] OR ROOM 01 Gender: Female Note Status: Finalized Procedure:         Colonoscopy Indications:       Screening for colorectal malignant neoplasm Providers:         Lucilla Lame, MD Referring MD:      Lennon Alstrom. Baucom (Referring MD) Medicines:         Propofol per Anesthesia Complications:     No immediate complications. Procedure:         Pre-Anesthesia Assessment:                    - Prior to the procedure, a History and Physical was                     performed, and patient medications and allergies were                     reviewed. The patient's tolerance of previous anesthesia                     was also reviewed. The risks and benefits of the procedure                     and the sedation options and risks were discussed with the                     patient. All questions were answered, and informed consent                     was obtained. Prior Anticoagulants: The patient has taken                     no previous anticoagulant or antiplatelet agents. ASA                     Grade Assessment: II - A patient with mild systemic                     disease. After reviewing the risks and benefits, the                     patient was deemed in satisfactory condition to undergo                     the procedure.                    After obtaining informed consent, the colonoscope was                     passed under direct vision. Throughout the procedure, the                     patient's blood pressure, pulse, and oxygen saturations                     were monitored continuously. The Olympus CF-HQ190L                     Colonoscope (S#. [id number]) was introduced through the anus  and advanced to the the cecum, identified by appendiceal            orifice and ileocecal valve. The colonoscopy was performed                     without difficulty. The patient tolerated the procedure                     well. The quality of the bowel preparation was fair. Findings:      The perianal and digital rectal examinations were normal.      Two sessile polyps were found in the transverse colon. The polyps were 3       to 4 mm in size. These polyps were removed with a cold biopsy forceps.       Resection and retrieval were complete.      A 5 mm polyp was found in the descending colon. The polyp was sessile.       The polyp was removed with a cold snare. Resection and retrieval were       complete.      A 3 mm polyp was found in the descending colon. The polyp was sessile.       The polyp was removed with a cold biopsy forceps. Resection and       retrieval were complete.      Non-bleeding internal hemorrhoids were found during retroflexion. The       hemorrhoids were Grade I (internal hemorrhoids that do not prolapse). Impression:        - Two 3 to 4 mm polyps in the transverse colon. Resected                     and retrieved.                    - One 5 mm polyp in the descending colon. Resected and                     retrieved.                    - One 3 mm polyp in the descending colon. Resected and                     retrieved.                    - Non-bleeding internal hemorrhoids. Recommendation:    - Repeat colonoscopy in 5 years if polyp adenoma and 10                     years if hyperplastic Procedure Code(s): --- Professional ---                    [phone], Colonoscopy, flexible; with removal of tumor(s),                     polyp(s), or other lesion(s) by snare technique                    45380, 74, Colonoscopy, flexible; with biopsy, single or                     multiple Diagnosis Code(s): --- Professional ---  Z12.11, Encounter for screening for malignant neoplasm of                     colon                     D12.3, Benign neoplasm of transverse colon                    D12.4, Benign neoplasm of descending colon CPT copyright 2014 American Medical Association. All rights reserved. The codes documented in this report are preliminary and upon coder review may  be revised to meet current compliance requirements. Lucilla Lame, MD 01/30/2016 9:08:57 AM This report has been signed electronically. Number of Addenda: 0 Note Initiated On: 01/30/2016 8:41 AM Scope Withdrawal Time: 0 hours 10 minutes 11 seconds  Total Procedure Duration: 0 hours 14 minutes 4 seconds       [facility]

## 2016-01-31 ENCOUNTER — Encounter: Payer: Self-pay | Admitting: Gastroenterology

## 2016-04-03 ENCOUNTER — Ambulatory Visit: Payer: Self-pay | Admitting: "Endocrinology

## 2016-04-23 ENCOUNTER — Ambulatory Visit (INDEPENDENT_AMBULATORY_CARE_PROVIDER_SITE_OTHER): Payer: Medicare HMO | Admitting: "Endocrinology

## 2016-04-23 ENCOUNTER — Encounter: Payer: Self-pay | Admitting: "Endocrinology

## 2016-04-23 ENCOUNTER — Other Ambulatory Visit: Payer: Self-pay

## 2016-04-23 VITALS — BP 131/80 | HR 65 | Wt 202.0 lb

## 2016-04-23 DIAGNOSIS — E349 Endocrine disorder, unspecified: Secondary | ICD-10-CM

## 2016-04-23 NOTE — Progress Notes (Signed)
Subjective:    Patient ID: Tina Morgan, female    DOB: [birth year], PCP Antionette Fairy, PA-C   Past Medical History  Diagnosis Date  . Hypertension   . Chronic pain   . Hyperlipidemia   . GERD (gastroesophageal reflux disease)   . Arthritis     osteoarthritis  . Lumbar disc disease     herniated disc  . MI (myocardial infarction) ([place])     [facility], No stents placed November 2016   Past Surgical History  Procedure Laterality Date  . Knee replacements    . Shoulder surgery    . Carpal tunnel release    . Abdominal hysterectomy    . Elbow arthroplasty Left   . Colonoscopy with propofol N/A 01/30/2016    Procedure: COLONOSCOPY WITH PROPOFOL;  Surgeon: Lucilla Lame, MD;  Location: [place];  Service: Endoscopy;  Laterality: N/A;  . Polypectomy  01/30/2016    Procedure: POLYPECTOMY;  Surgeon: Lucilla Lame, MD;  Location: [place];  Service: Endoscopy;;   Social History   Social History  . Marital Status: Widowed    Spouse Name: N/A  . Number of Children: N/A  . Years of Education: N/A   Social History Main Topics  . Smoking status: Never Smoker   . Smokeless tobacco: None  . Alcohol Use: No  . Drug Use: No  . Sexual Activity: Not Asked   Other Topics Concern  . None   Social History Narrative   Outpatient Encounter Prescriptions as of 04/23/2016  Medication Sig  . cetirizine (ZYRTEC) 10 MG tablet Take 10 mg by mouth daily.  Marland Kitchen dicyclomine (BENTYL) 10 MG capsule Take 10 mg by mouth 4 (four) times daily -  before meals and at bedtime. Reported on 01/24/2016  . DULoxetine (CYMBALTA) 60 MG capsule Take 60 mg by mouth daily.   Marland Kitchen esomeprazole (NEXIUM) 40 MG capsule Take 40 mg by mouth daily at 12 noon.  . fentaNYL (DURAGESIC - DOSED MCG/HR) 100 MCG/HR Reported on 01/24/2016  . fluticasone (FLONASE) 50 MCG/ACT nasal spray as needed.   . gabapentin (NEURONTIN) 300 MG capsule Take 300 mg by mouth 4 (four) times daily.  Marland Kitchen HYDROmorphone (DILAUDID) 4 MG  tablet Reported on 01/24/2016  . isosorbide dinitrate (ISORDIL) 10 MG tablet Take 10 mg by mouth 3 (three) times daily.  . Linaclotide (LINZESS) 290 MCG CAPS capsule Take 1 capsule (290 mcg total) by mouth daily.  Marland Kitchen lisinopril (PRINIVIL,ZESTRIL) 10 MG tablet Take 10 mg by mouth daily.  . metoprolol (LOPRESSOR) 50 MG tablet Take 50 mg by mouth 2 (two) times daily.  . mirabegron ER (MYRBETRIQ) 50 MG TB24 tablet Take by mouth.  . rosuvastatin (CRESTOR) 5 MG tablet Take 5 mg by mouth daily.  . tapentadol (NUCYNTA) 50 MG TABS tablet Take 50 mg by mouth 3 (three) times daily.  . Teriparatide, Recombinant, 600 MCG/2.4ML SOLN Inject 20 mcg into the skin daily.  Marland Kitchen tiZANidine (ZANAFLEX) 4 MG capsule Take 4 mg by mouth 2 (two) times daily.  Marland Kitchen zolpidem (AMBIEN) 10 MG tablet Take 10 mg by mouth at bedtime as needed for sleep.  . [DISCONTINUED] acetaminophen (TYLENOL) 325 MG tablet Take 650 mg by mouth every 4 (four) hours as needed.  . [DISCONTINUED] cloNIDine (CATAPRES) 0.2 MG tablet Reported on 01/24/2016  . [DISCONTINUED] diazepam (VALIUM) 2 MG tablet Take 2 mg by mouth every 6 (six) hours as needed for anxiety. Reported on 01/24/2016  . [DISCONTINUED] diazepam (VALIUM) 5 MG  tablet Reported on 01/24/2016  . [DISCONTINUED] fentaNYL (DURAGESIC - DOSED MCG/HR) 50 MCG/HR Place 50 mcg onto the skin every 3 (three) days.  . [DISCONTINUED] gabapentin (NEURONTIN) 100 MG capsule Take 300 mg by mouth 4 (four) times daily.   . [DISCONTINUED] gabapentin (NEURONTIN) 600 MG tablet Reported on 01/24/2016  . [DISCONTINUED] HYDROmorphone (DILAUDID) 2 MG tablet Take 4 mg by mouth every 6 (six) hours as needed for severe pain.   . [DISCONTINUED] levofloxacin (LEVAQUIN) 750 MG tablet Take 750 mg by mouth daily. Reported on 01/30/2016  . [DISCONTINUED] loperamide (IMODIUM) 2 MG capsule Take 2 mg by mouth as needed for diarrhea or loose stools. Reported on 01/24/2016  . [DISCONTINUED] metroNIDAZOLE (FLAGYL) 500 MG tablet Take 500 mg  by mouth 3 (three) times daily. Reported on 01/24/2016  . [DISCONTINUED] modafinil (PROVIGIL) 100 MG tablet Take 100 mg by mouth daily. Reported on 01/24/2016  . [DISCONTINUED] polyethylene glycol-electrolytes (TRILYTE) 420 g solution Take 4,000 mLs by mouth as directed. Drink one 8oz glass every 30 mins until stools are clear  . [DISCONTINUED] potassium chloride SA (K-DUR,KLOR-CON) 20 MEQ tablet Take 1 tablet (20 mEq total) by mouth 2 (two) times daily. (Patient not taking: Reported on 12/12/2015)  . [DISCONTINUED] QUEtiapine (SEROQUEL) 50 MG tablet Reported on 01/24/2016  . [DISCONTINUED] sertraline (ZOLOFT) 50 MG tablet Take 50 mg by mouth daily. Reported on 01/24/2016  . [DISCONTINUED] zolpidem (AMBIEN) 10 MG tablet Reported on 01/30/2016   No facility-administered encounter medications on file as of 04/23/2016.   ALLERGIES: Allergies  Allergen Reactions  . Codeine Itching  . Morphine And Related Itching  . Nsaids     itching   VACCINATION STATUS:  There is no immunization history on file for this patient.  HPI  65 year old female with medical history as above. She is being seen in consultation for elevated PTH requested by Dr. Neysa Hotter. -She has history of osteoporosis for "several years ". She was given prescription for Forteo, did not take it consistently until 4 months ago. -Nice history of nephrolithiasis, seizure disorders. She has history of constipation. -She denies any history of hypercalcemia. She denies any family history of hyperparathyroidism. She does not recall if she had 24 hour urine calcium determined. She states high PTH was discovered during her annual physical. She is not sure where and when her last bone density was performed. -Her intake of dairy products is that of average. She is not on thiazide diuretics.  Review of Systems Constitutional: no weight gain/loss, no fatigue, no subjective hyperthermia/hypothermia Eyes: no blurry vision, no xerophthalmia ENT:  no sore throat, no nodules palpated in throat, no dysphagia/odynophagia, no hoarseness Cardiovascular: no CP/SOB/palpitations/leg swelling Respiratory: no cough/SOB Gastrointestinal:  Reports constipation on inzess.  Musculoskeletal: + jjoint aches Skin: no rashes Neurological: no tremors/numbness/tingling/dizziness Psychiatric: no depression/anxiety  Objective:    BP 131/80 mmHg  Pulse 65  Wt 202 lb (91.627 kg)  SpO2 95%  Wt Readings from Last 3 Encounters:  04/23/16 202 lb (91.627 kg)  01/30/16 201 lb (91.173 kg)  12/12/15 211 lb (95.709 kg)    Physical Exam  .Constitutional:  in NAD Eyes: PERRLA, EOMI, no exophthalmos ENT: moist mucous membranes, no thyromegaly, no cervical lymphadenopathy Cardiovascular: RRR, No MRG Respiratory: CTA B Gastrointestinal: abdomen soft, NT, ND, BS+ Musculoskeletal: no deformities, strength intact in all 4 Skin: moist, warm, no rashes Neurological: no tremor with outstretched hands, DTR normal in all 4  CMP     Component Value Date/Time   NA 137  11/10/2015 0228   K 3.0* 11/10/2015 0228   CL 102 11/10/2015 0228   CO2 25 11/10/2015 0228   GLUCOSE 117* 11/10/2015 0228   BUN 16 11/10/2015 0228   CREATININE 0.73 11/10/2015 0228   CALCIUM 9.0 11/10/2015 0228   PROT 7.0 11/10/2015 0228   ALBUMIN 4.0 11/10/2015 0228   AST 71* 11/10/2015 0228   ALT 80* 11/10/2015 0228   ALKPHOS 55 11/10/2015 0228   BILITOT 0.7 11/10/2015 0228   GFRNONAA >60 11/10/2015 0228   GFRAA >60 11/10/2015 0228   On 02/29/2016 her PTH was found to be 126 (normal 1 4-6 4 pg per mL) associated with high normal calcium of 9.9.    Assessment & Plan:   1. Elevated parathyroid hormone 2. Osteoporosis - I have reviewed her available records and evaluated patient clinically. She has elevated parathyroid hormone associated with normal calcium you. Not clear if she has normal calcium intake early primary hyperparathyroidism or secondary hyperparathyroidism. The fact  that she did have osteoporosis for several years points to the possibility of chronic hyperparathyroidism in her case. I will proceed to repeat her parathyroid workup including serum magnesium, PTH/calcium, phosphorus, vitamin D level, and we'll obtain 24-hour urine for calcium. -We had a long discussion on complications of hyperparathyroidism is untreated. If she is confirmed to have primary hyperparathyroidism, she will be considered for surgery. I have advised her to continue Forteo for treatment of osteoporosis, however she may need repeat bone density to assess effect of therapy. It is preferable for her to obtain repeat bone density on the same machine that she had her original bone density was done. She will return with more information on this. -I advised her to avoid over-the-counter calcium supplements for now. -She will return in 2 weeks with above studies for treatment decision.  - I advised patient to maintain close follow up with BAUCOM, JENNY B, PA-C for primary care needs. Follow up plan: Return in about 2 weeks (around 05/07/2016) for Hyperparathyroidism.  Glade Lloyd, MD Phone: [phone]  Fax: [phone]   04/23/2016, 3:08 PM

## 2016-04-24 LAB — VITAMIN D 25 HYDROXY (VIT D DEFICIENCY, FRACTURES): VIT D 25 HYDROXY: 27 ng/mL — AB (ref 30–100)

## 2016-04-24 LAB — PTH, INTACT AND CALCIUM
CALCIUM: 9.5 mg/dL (ref 8.4–10.5)
PTH: 57 pg/mL (ref 14–64)

## 2016-04-24 LAB — PHOSPHORUS: PHOSPHORUS: 4.3 mg/dL (ref 2.5–4.5)

## 2016-04-24 LAB — MAGNESIUM: Magnesium: 1.8 mg/dL (ref 1.5–2.5)

## 2016-04-28 LAB — CALCIUM, URINE, 24 HOUR
CALCIUM UR: 9 mg/dL
Calcium, 24 hour urine: 104 mg/24 h (ref 35–250)

## 2016-05-03 ENCOUNTER — Encounter: Payer: Self-pay | Admitting: "Endocrinology

## 2016-05-14 ENCOUNTER — Encounter: Payer: Self-pay | Admitting: "Endocrinology

## 2016-05-14 ENCOUNTER — Ambulatory Visit (INDEPENDENT_AMBULATORY_CARE_PROVIDER_SITE_OTHER): Payer: Medicare HMO | Admitting: "Endocrinology

## 2016-05-14 VITALS — BP 141/83 | HR 66 | Ht 65.0 in | Wt 205.0 lb

## 2016-05-14 DIAGNOSIS — E349 Endocrine disorder, unspecified: Secondary | ICD-10-CM | POA: Diagnosis not present

## 2016-05-14 DIAGNOSIS — M810 Age-related osteoporosis without current pathological fracture: Secondary | ICD-10-CM | POA: Diagnosis not present

## 2016-05-14 DIAGNOSIS — E559 Vitamin D deficiency, unspecified: Secondary | ICD-10-CM | POA: Diagnosis not present

## 2016-05-14 MED ORDER — VITAMIN D3 125 MCG (5000 UT) PO CAPS: 5000.0000 [IU] | ORAL_CAPSULE | Freq: Every day | ORAL | Status: AC

## 2016-05-14 NOTE — Progress Notes (Signed)
Subjective:    Patient ID: Tina Morgan, female    DOB: 29-Sep-1951, PCP Antionette Fairy, PA-C   Past Medical History  Diagnosis Date  . Hypertension   . Chronic pain   . Hyperlipidemia   . GERD (gastroesophageal reflux disease)   . Arthritis     osteoarthritis  . Lumbar disc disease     herniated disc  . MI (myocardial infarction) ([place])     [facility], No stents placed November 2016   Past Surgical History  Procedure Laterality Date  . Knee replacements    . Shoulder surgery    . Carpal tunnel release    . Abdominal hysterectomy    . Elbow arthroplasty Left   . Colonoscopy with propofol N/A 01/30/2016    Procedure: COLONOSCOPY WITH PROPOFOL;  Surgeon: Lucilla Lame, MD;  Location: [place];  Service: Endoscopy;  Laterality: N/A;  . Polypectomy  01/30/2016    Procedure: POLYPECTOMY;  Surgeon: Lucilla Lame, MD;  Location: [place];  Service: Endoscopy;;   Social History   Social History  . Marital Status: Widowed    Spouse Name: N/A  . Number of Children: N/A  . Years of Education: N/A   Social History Main Topics  . Smoking status: Never Smoker   . Smokeless tobacco: None  . Alcohol Use: No  . Drug Use: No  . Sexual Activity: Not Asked   Other Topics Concern  . None   Social History Narrative   Outpatient Encounter Prescriptions as of 05/14/2016  Medication Sig  . cetirizine (ZYRTEC) 10 MG tablet Take 10 mg by mouth daily.  . Cholecalciferol (VITAMIN D3) 5000 units CAPS Take 1 capsule (5,000 Units total) by mouth daily.  Marland Kitchen dicyclomine (BENTYL) 10 MG capsule Take 10 mg by mouth 4 (four) times daily -  before meals and at bedtime. Reported on 01/24/2016  . DULoxetine (CYMBALTA) 60 MG capsule Take 60 mg by mouth daily.   Marland Kitchen esomeprazole (NEXIUM) 40 MG capsule Take 40 mg by mouth daily at 12 noon.  . fentaNYL (DURAGESIC - DOSED MCG/HR) 100 MCG/HR Reported on 01/24/2016  . fluticasone (FLONASE) 50 MCG/ACT nasal spray as needed.   . gabapentin  (NEURONTIN) 300 MG capsule Take 300 mg by mouth 4 (four) times daily.  Marland Kitchen HYDROmorphone (DILAUDID) 4 MG tablet Reported on 01/24/2016  . isosorbide dinitrate (ISORDIL) 10 MG tablet Take 10 mg by mouth 3 (three) times daily.  . Linaclotide (LINZESS) 290 MCG CAPS capsule Take 1 capsule (290 mcg total) by mouth daily.  Marland Kitchen lisinopril (PRINIVIL,ZESTRIL) 10 MG tablet Take 10 mg by mouth daily.  . metoprolol (LOPRESSOR) 50 MG tablet Take 50 mg by mouth 2 (two) times daily.  . mirabegron ER (MYRBETRIQ) 50 MG TB24 tablet Take by mouth.  . rosuvastatin (CRESTOR) 5 MG tablet Take 5 mg by mouth daily.  . tapentadol (NUCYNTA) 50 MG TABS tablet Take 50 mg by mouth 3 (three) times daily.  . Teriparatide, Recombinant, 600 MCG/2.4ML SOLN Inject 20 mcg into the skin daily.  Marland Kitchen tiZANidine (ZANAFLEX) 4 MG capsule Take 4 mg by mouth 2 (two) times daily.  Marland Kitchen zolpidem (AMBIEN) 10 MG tablet Take 10 mg by mouth at bedtime as needed for sleep.   No facility-administered encounter medications on file as of 05/14/2016.   ALLERGIES: Allergies  Allergen Reactions  . Codeine Itching  . Morphine And Related Itching  . Nsaids     itching   VACCINATION STATUS:  There is no  immunization history on file for this patient.  HPI  65 year old female with medical history as above. She is being seen in follow up  for elevated PTH. - She was sent for repeat labs for PTH/calcium, 24 hour urine calcium, vitamin D, magnesium. -She has history of osteoporosis for "several years ". She is on Forteo.  -No history of nephrolithiasis, seizure disorders. She has history of constipation. -She denies any history of hypercalcemia. She denies any family history of hyperparathyroidism. - Her bone days discomfort from August, 2015 showed that she has osteoporosis of the spine. The scan was not repeated since 2015. -Her intake of dairy products is that of average. She is not on thiazide diuretics.  Review of Systems Constitutional: no weight  gain/loss, no fatigue, no subjective hyperthermia/hypothermia Eyes: no blurry vision, no xerophthalmia ENT: no sore throat, no nodules palpated in throat, no dysphagia/odynophagia, no hoarseness Cardiovascular: no CP/SOB/palpitations/leg swelling Respiratory: no cough/SOB Gastrointestinal:  Reports constipation on inzess.  Musculoskeletal: + jjoint aches Skin: no rashes Neurological: no tremors/numbness/tingling/dizziness Psychiatric: no depression/anxiety  Objective:    BP 141/83 mmHg  Pulse 66  Ht 5\' 5"  (1.651 m)  Wt 205 lb (92.987 kg)  BMI 34.11 kg/m2  SpO2 97%  Wt Readings from Last 3 Encounters:  05/14/16 205 lb (92.987 kg)  04/23/16 202 lb (91.627 kg)  01/30/16 201 lb (91.173 kg)    Physical Exam  .Constitutional:  in NAD Eyes: PERRLA, EOMI, no exophthalmos ENT: moist mucous membranes, no thyromegaly, no cervical lymphadenopathy Cardiovascular: RRR, No MRG Respiratory: CTA B Gastrointestinal: abdomen soft, NT, ND, BS+ Musculoskeletal: no deformities, strength intact in all 4 Skin: moist, warm, no rashes Neurological: no tremor with outstretched hands, DTR normal in all 4  Recent Results (from the past 2160 hour(s))  Magnesium     Status: None   Collection Time: 04/23/16  2:22 PM  Result Value Ref Range   Magnesium 1.8 1.5 - 2.5 mg/dL  PTH, intact and calcium     Status: None   Collection Time: 04/23/16  2:22 PM  Result Value Ref Range   PTH 57 14 - 64 pg/mL   Calcium 9.5 8.4 - 10.5 mg/dL    Comment:   Interpretive Guide:                              Intact PTH               Calcium                              ----------               ------- Normal Parathyroid           Normal                   Normal Hypoparathyroidism           Low or Low Normal        Low Hyperparathyroidism      Primary                 Normal or High           High      Secondary               High  Normal or Low      Tertiary                High                      High Non-Parathyroid   Hypercalcemia              Low or Low Normal        High   Phosphorus     Status: None   Collection Time: 04/23/16  2:22 PM  Result Value Ref Range   Phosphorus 4.3 2.5 - 4.5 mg/dL  VITAMIN D 25 Hydroxy (Vit-D Deficiency, Fractures)     Status: Abnormal   Collection Time: 04/23/16  2:22 PM  Result Value Ref Range   Vit D, 25-Hydroxy 27 (L) 30 - 100 ng/mL    Comment: Vitamin D Status           25-OH Vitamin D        Deficiency                <20 ng/mL        Insufficiency         20 - 29 ng/mL        Optimal             > or = 30 ng/mL   For 25-OH Vitamin D testing on patients on D2-supplementation and patients for whom quantitation of D2 and D3 fractions is required, the QuestAssureD 25-OH VIT D, (D2,D3), LC/MS/MS is recommended: order code [id number] (patients > 2 yrs).   Calcium, urine, 24 hour     Status: None   Collection Time: 04/27/16 11:54 AM  Result Value Ref Range   Calcium, Ur 9 Not estab mg/dL   Calcium, 24 hour urine 104 35 - 250 mg/24 h    Comment:                                   Reference Range   35-250                                   Low calcium diet  35-200         Assessment & Plan:   1. Elevated parathyroid hormone- resolved 2. Osteoporosis 3. Vitamin D deficiency  - Repeat studies are negative for primary hyperparathyroidism.  - PTH is 57, calcium 9.7, and 24 hour urine calcium is 104 . -She will not need any anti-parathyroid intervention for now. -She has vitamin D deficiency, will be supplemented. - Osteoporosis is not induced by hyperparathyroidism.  - I advised her to stay on and be consistent with Forteo treatment. - She will need repeat DEXA in August 2017.  -I advised her to avoid over-the-counter calcium supplements for now. -She will return in 4 months with repeat labs and DEXA scan. - I advised patient to maintain close follow up with BAUCOM, JENNY B, PA-C for primary care needs. Follow up plan: Return in about 4  months (around 09/14/2016) for follow up with pre-visit labs.  Glade Lloyd, MD Phone: [phone]  Fax: [phone]   05/14/2016, 2:41 PM

## 2016-09-17 ENCOUNTER — Ambulatory Visit: Payer: Medicare HMO | Admitting: "Endocrinology

## 2016-12-18 IMAGING — CT CT ABD-PELV W/ CM
2 of 5 series · 16 of 46 positions shown, 18 images · IV contrast (Omnipaque 300)
Comparison: Abdominal ultrasound May 08, 2012

CLINICAL DATA: Lower abdominal pain, diarrhea for 4 days. On
several antibiotics. History of hypertension and chronic pain.

EXAM:
CT ABDOMEN AND PELVIS WITH CONTRAST
TECHNIQUE: Multidetector CT imaging of the abdomen and pelvis was performed
using the standard protocol following bolus administration of
intravenous contrast.
CONTRAST:  100mL OMNIPAQUE IOHEXOL 300 MG/ML  SOLN

[Series 2: abd_pel_with 5.0 b40f · axial · 0.96mm/px · z∈[-498,-73]mm · 13 of 97 slices shown, 15 images]
[im 6/97  soft-tissue]
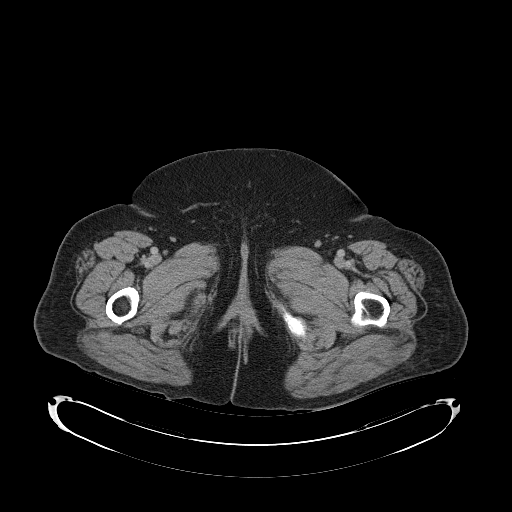
[im 6/97  bone]
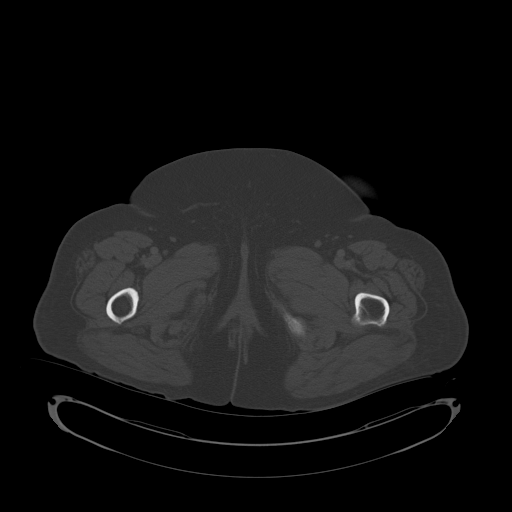
[im 12/97  soft-tissue]
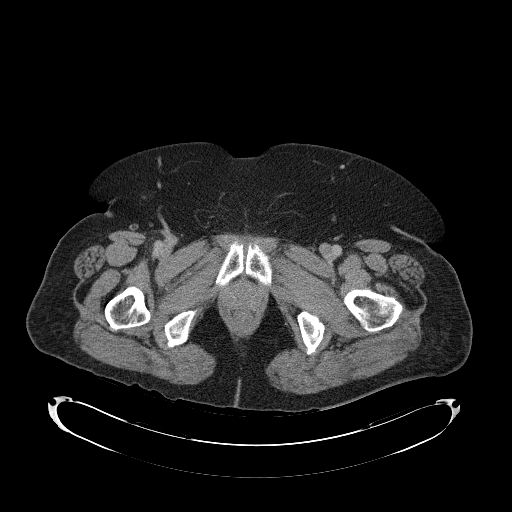
[im 23/97  soft-tissue]
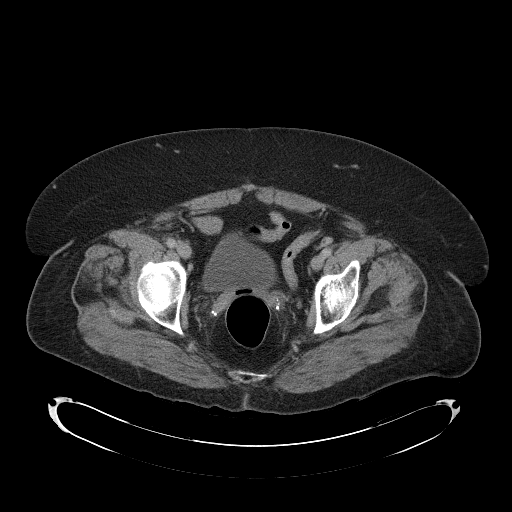
[im 29/97  soft-tissue]
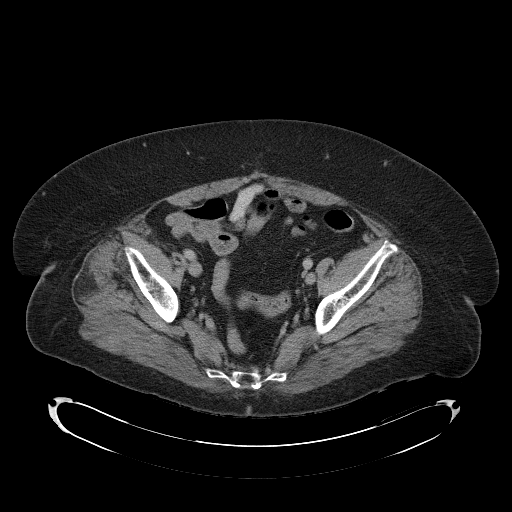
[im 34/97  soft-tissue]
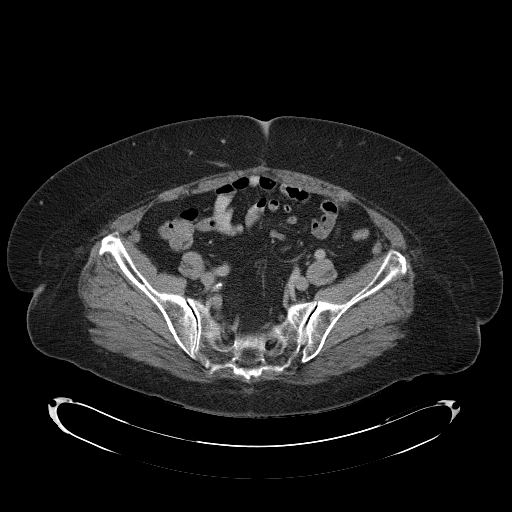
[im 40/97  soft-tissue]
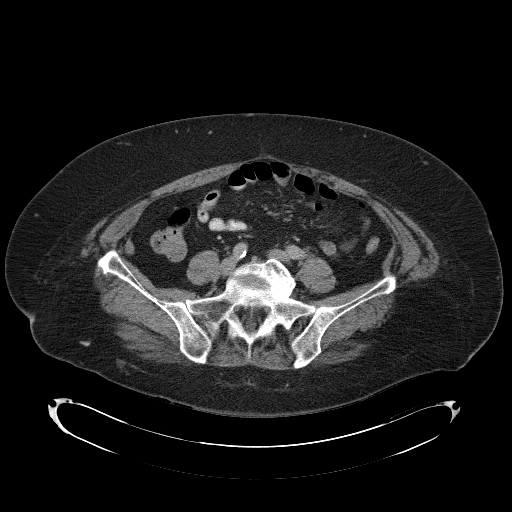
[im 51/97  soft-tissue]
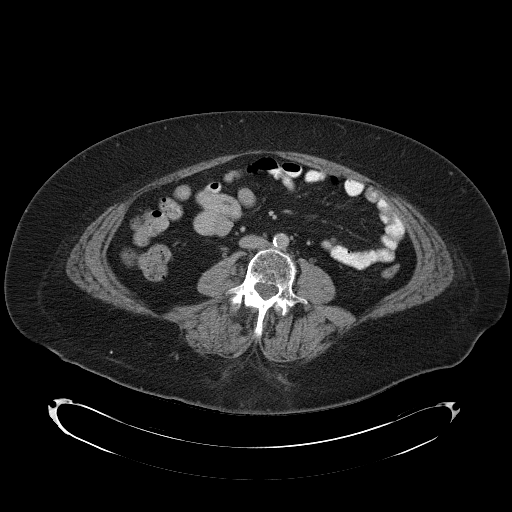
[im 57/97  soft-tissue]
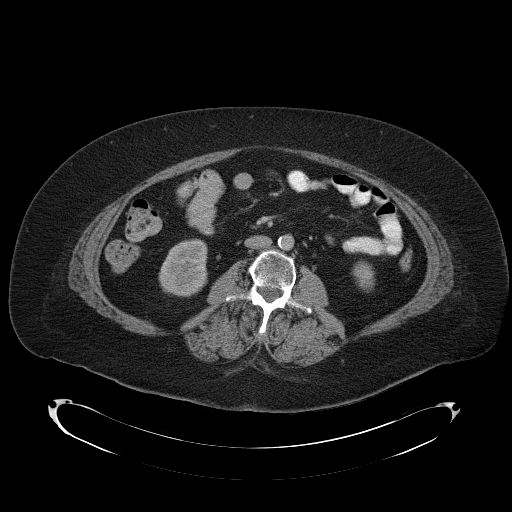
[im 63/97  soft-tissue]
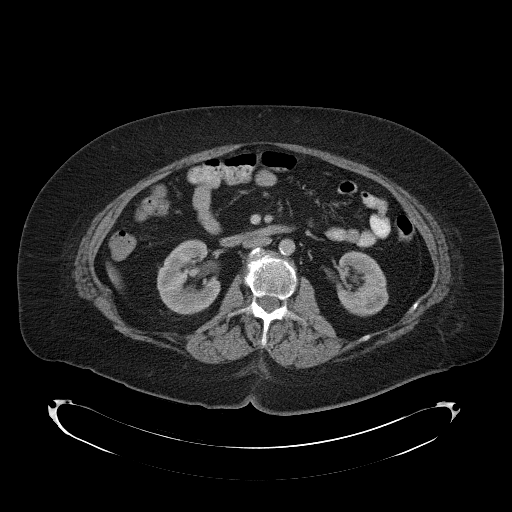
[im 63/97  bone]
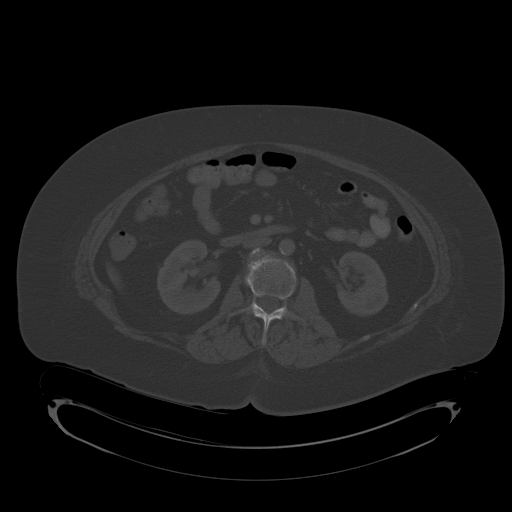
[im 68/97  soft-tissue]
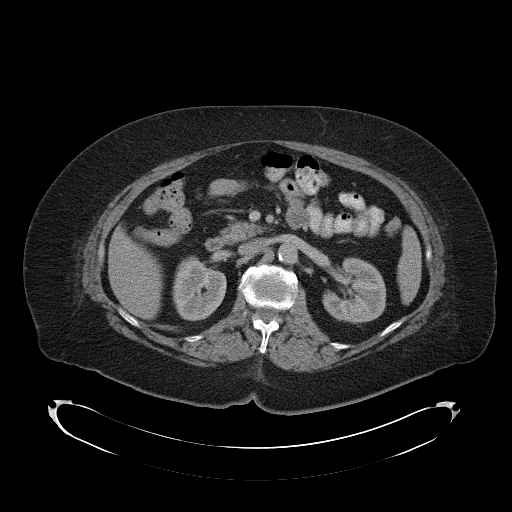
[im 74/97  soft-tissue]
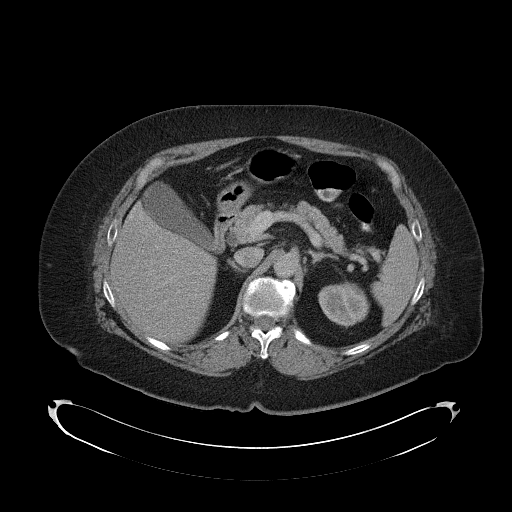
[im 85/97  soft-tissue]
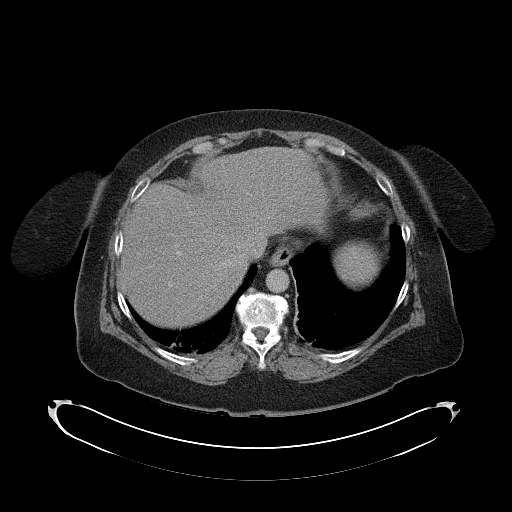
[im 91/97  soft-tissue]
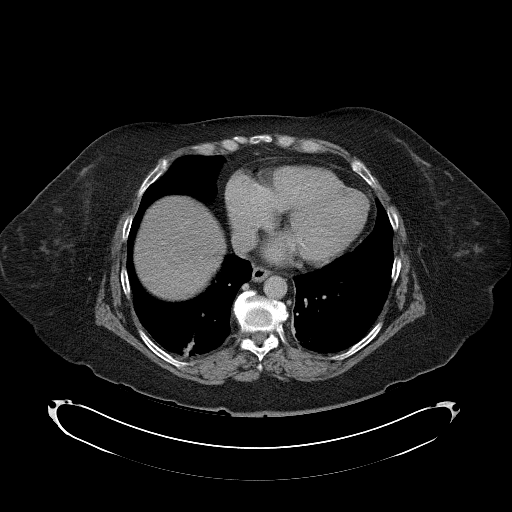

[Series 3: abd_pel_with 3.0 spo cor · coronal · 0.86mm/px · 3 of 94 slices shown]
[im 32/94  soft-tissue]
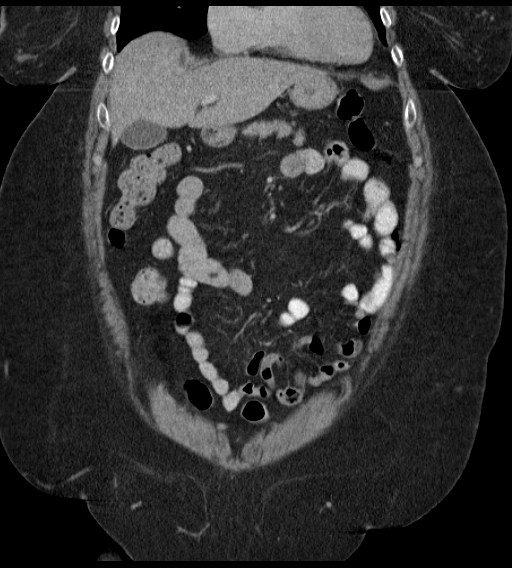
[im 42/94  soft-tissue]
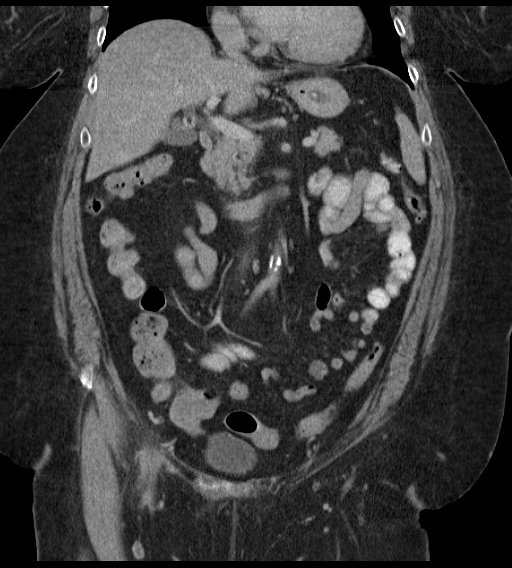
[im 52/94  soft-tissue]
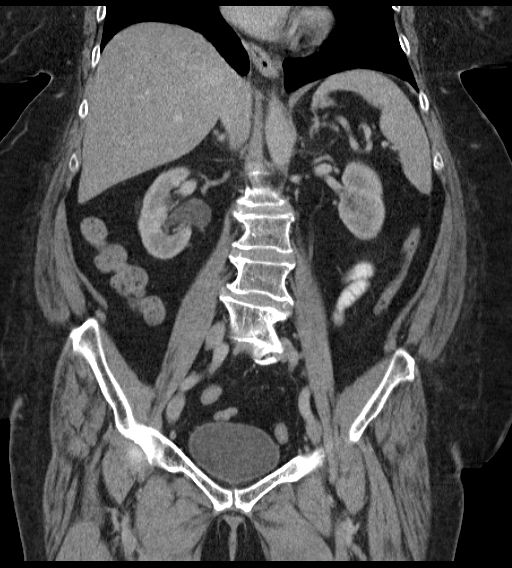

[16 of 46 positions shown; findings below may reference images not displayed]

FINDINGS: LUNG BASES: RIGHT lower lobe strandy densities, a 10 mm nodular
density, axial [DATE]. Mild dependent atelectasis LEFT lung. Included
heart size is normal. No pericardial effusions.

SOLID ORGANS: The liver is diffusely hypodense compatible with
steatosis. Subcentimeter probable cyst versus lymphangioma in the
spleen. Gallbladder, pancreas and adrenal glands are unremarkable.

GASTROINTESTINAL TRACT: The stomach, small and large bowel are
normal in course and caliber without inflammatory changes. The
appendix is not discretely identified, however there are no
inflammatory changes in the right lower quadrant.

KIDNEYS/ URINARY TRACT: Kidneys are orthotopic, demonstrating
symmetric enhancement. No nephrolithiasis, hydronephrosis or solid
renal masses. Bilateral extrarenal pelvises. 22 mm cyst RIGHT
interpolar kidney. The unopacified ureters are normal in course and
caliber. Delayed imaging through the kidneys demonstrates symmetric
prompt contrast excretion within the proximal urinary collecting
system. Urinary bladder is partially distended and unremarkable.

PERITONEUM/RETROPERITONEUM: Aortoiliac vessels are normal in course
and caliber, mild calcific atherosclerosis. No lymphadenopathy by CT
size criteria. Status post hysterectomy. No intraperitoneal free
fluid nor free air.

SOFT TISSUE/OSSEOUS STRUCTURES: Non-suspicious. Mild nonspecific fat
stranding RIGHT inguinal canal. Broad lumbar levoscoliosis.
Degenerative change lumbar spine resulting in severe LEFT L5-S1
neural foraminal narrowing. Severe RIGHT L4-5 neural foraminal
narrowing. Anterior abdominal wall scarring.
IMPRESSION: No acute intra-abdominal or pelvic process.

10 mm nodular density RIGHT lower lobe associated strandy densities
may be inflammatory though, recommend follow-up CT in 3 months or
PET/CT or Biopsy for further evaluation. This recommendation follows
the consensus statement: "Guidelines for Management of Small
Pulmonary Nodules Detected on CT Scans: A Statement from the

Hepatic steatosis.

## 2020-06-06 ENCOUNTER — Encounter: Payer: Self-pay | Admitting: *Deleted

## 2020-06-07 ENCOUNTER — Ambulatory Visit: Payer: Medicare HMO | Admitting: Cardiology

## 2020-06-07 NOTE — Progress Notes (Deleted)
Clinical Summary Ms. Hoobler is a 69 y.o.female seen as new consult  Previously followed by [facility]     1. Palpitations - prior monitor at [facility] 2017 showed symptoms collreated with SR and rare PACs    2. History of demand ischemia - [facility] history of NSTEMI in 2016. She was found unresponsive, unclear how long she was down. Ultimately treated for sepsis, had respiratory failure requiring intubation. +Trop at the time, cath without significant disease  3. Chronic pain syndrome    Past Medical History:  Diagnosis Date  . Arthritis    osteoarthritis  . Chronic pain   . GERD (gastroesophageal reflux disease)   . Hyperlipidemia   . Hypertension   . Lumbar disc disease    herniated disc  . MI (myocardial infarction) ([place])    [facility], No stents placed November 2016     Allergies  Allergen Reactions  . Codeine Itching  . Morphine And Related Itching  . Nsaids     itching     Current Outpatient Medications  Medication Sig Dispense Refill  . cetirizine (ZYRTEC) 10 MG tablet Take 10 mg by mouth daily.    . Cholecalciferol (VITAMIN D3) 5000 units CAPS Take 1 capsule (5,000 Units total) by mouth daily. 90 capsule 0  . dicyclomine (BENTYL) 10 MG capsule Take 10 mg by mouth 4 (four) times daily -  before meals and at bedtime. Reported on 01/24/2016    . DULoxetine (CYMBALTA) 60 MG capsule Take 60 mg by mouth daily.     Marland Kitchen esomeprazole (NEXIUM) 40 MG capsule Take 40 mg by mouth daily at 12 noon.    . fentaNYL (DURAGESIC - DOSED MCG/HR) 100 MCG/HR Reported on 01/24/2016    . fluticasone (FLONASE) 50 MCG/ACT nasal spray as needed.     . gabapentin (NEURONTIN) 300 MG capsule Take 300 mg by mouth 4 (four) times daily.    Marland Kitchen HYDROmorphone (DILAUDID) 4 MG tablet Reported on 01/24/2016    . isosorbide dinitrate (ISORDIL) 10 MG tablet Take 10 mg by mouth 3 (three) times daily.    . Linaclotide (LINZESS) 290 MCG CAPS capsule Take 1 capsule (290 mcg total) by mouth  daily. 30 capsule 6  . lisinopril (PRINIVIL,ZESTRIL) 10 MG tablet Take 10 mg by mouth daily.    . metoprolol (LOPRESSOR) 50 MG tablet Take 50 mg by mouth 2 (two) times daily.    . mirabegron ER (MYRBETRIQ) 50 MG TB24 tablet Take by mouth.    . rosuvastatin (CRESTOR) 5 MG tablet Take 5 mg by mouth daily.    . tapentadol (NUCYNTA) 50 MG TABS tablet Take 50 mg by mouth 3 (three) times daily.    . Teriparatide, Recombinant, 600 MCG/2.4ML SOLN Inject 20 mcg into the skin daily.    Marland Kitchen tiZANidine (ZANAFLEX) 4 MG capsule Take 4 mg by mouth 2 (two) times daily.    Marland Kitchen zolpidem (AMBIEN) 10 MG tablet Take 10 mg by mouth at bedtime as needed for sleep.     No current facility-administered medications for this visit.     Past Surgical History:  Procedure Laterality Date  . ABDOMINAL HYSTERECTOMY    . CARPAL TUNNEL RELEASE    . COLONOSCOPY WITH PROPOFOL N/A 01/30/2016   Procedure: COLONOSCOPY WITH PROPOFOL;  Surgeon: Lucilla Lame, MD;  Location: [place];  Service: Endoscopy;  Laterality: N/A;  . ELBOW ARTHROPLASTY Left   . knee replacements    . POLYPECTOMY  01/30/2016   Procedure:  POLYPECTOMY;  Surgeon: Lucilla Lame, MD;  Location: [place];  Service: Endoscopy;;  . SHOULDER SURGERY       Allergies  Allergen Reactions  . Codeine Itching  . Morphine And Related Itching  . Nsaids     itching      Family History  Problem Relation Age of Onset  . Cancer Mother   . Osteoporosis Mother   . Hypertension Father   . Heart attack Father   . Diabetes Sister   . Diabetes Brother      Social History Ms. Raimondi reports that she has never smoked. She does not have any smokeless tobacco history on file. Ms. Damron reports no history of alcohol use.   Review of Systems CONSTITUTIONAL: No weight loss, fever, chills, weakness or fatigue.  HEENT: Eyes: No visual loss, blurred vision, double vision or yellow sclerae.No hearing loss, sneezing, congestion, runny nose or sore  throat.  SKIN: No rash or itching.  CARDIOVASCULAR:  RESPIRATORY: No shortness of breath, cough or sputum.  GASTROINTESTINAL: No anorexia, nausea, vomiting or diarrhea. No abdominal pain or blood.  GENITOURINARY: No burning on urination, no polyuria NEUROLOGICAL: No headache, dizziness, syncope, paralysis, ataxia, numbness or tingling in the extremities. No change in bowel or bladder control.  MUSCULOSKELETAL: No muscle, back pain, joint pain or stiffness.  LYMPHATICS: No enlarged nodes. No history of splenectomy.  PSYCHIATRIC: No history of depression or anxiety.  ENDOCRINOLOGIC: No reports of sweating, cold or heat intolerance. No polyuria or polydipsia.  Marland Kitchen   Physical Examination There were no vitals filed for this visit. There were no vitals filed for this visit.  Gen: resting comfortably, no acute distress HEENT: no scleral icterus, pupils equal round and reactive, no palptable cervical adenopathy,  CV Resp: Clear to auscultation bilaterally GI: abdomen is soft, non-tender, non-distended, normal bowel sounds, no hepatosplenomegaly MSK: extremities are warm, no edema.  Skin: warm, no rash Neuro:  no focal deficits Psych: appropriate affect   Diagnostic Studies 09/2016 [facility] Heart monitor The patient was monitored for 167:27 hours, of which 166:24 hours were usable.  Average heart rate for the monitored period was 69 BPM.  Tachycardia was present for 2% of the readable data; Bradycardia was present for 9% of the readable data.  No Pause(s) noted of 3 seconds or longer.  18 manually-triggered recording(s) posted with symptoms including Rapid HR/Palp/Flutter, Dizziness/Lightheaded.  Symptoms correlated with sinus rhythm and rare PACs.     10/2015 echo [place] Morphology:Normal  Mobility:Fully mobile   LEFT VENTRICLE    Size:NormalAnterior:Normal  Contraction:REGIONALLY IMPAIREDLateral:Normal  Closest EF:>55% (Estimated)Septal:Normal   DDU:KGUR LVH CONCENTRIC Apical:Normal   Inferior:HYPOCONTRACTILE  Posterior:Normal  Dias.FxClass:(Grade 1) relaxation abnormal, E/A reversal   MITRAL VALVE  Leaflets:NormalMobility:Fully mobile  Morphology:Normal   LEFT ATRIUM  Size:Normal LA Masses:No masses   IA Septum:Normal IAS   MAIN PA  Size:Normal   PULMONIC VALVE  Morphology:NormalMobility:Fully mobile   RIGHT VENTRICLE  Size:Normal Free Wall:Normal  Contraction:Normal   RV Note:Moderate RVH BEST SEEN IN SUBCOSTAL VIEWS   TRICUSPID VALVE  Leaflets:NormalMobility:Fully mobile  Morphology:Normal   RIGHT ATRIUM  Size:NormalRA Other:None   RA Mass:No masses   PERICARDIUM   Fluid:No effusion   INFERIOR VENACAVA  Size:Normal Normal respiratory collapse    ___________________________________________________________________  DOPPLER ECHO and OTHER SPECIAL PROCEDURES   Aortic:No AR No AS    Mitral:No MR No MS  MV Inflow E Vel=64.2 cm/sec MV Annulus  E'Vel=nm*  E/E'Ratio=nm*   Tricuspid:TRIVIAL TRNo TS  262.0 cm/sec peak TR vel37.5 mmHg peak RV pressure   Pulmonary:TRIVIAL PRNo PS     10/2015 cath   Assessment and Plan        Arnoldo Lenis, M.D., F.A.C.C.

## 2020-07-01 ENCOUNTER — Other Ambulatory Visit: Payer: Self-pay

## 2020-07-01 ENCOUNTER — Ambulatory Visit: Payer: Medicare HMO | Admitting: Cardiology

## 2020-07-01 NOTE — Progress Notes (Deleted)
Clinical Summary Ms. Vey is a 69 y.o.female   Previously followed by [facility] cardiology, last note from 08/2016   1. Palpitations - 09/2016 monitor with [facility] showed SR with rare PACs  1. History of demand ischemia - history of NSTEMI. Admitted with LOC in 2016, admitted with sepsis and respiratory failure requiring intubation - notes mention etiology thought to be polypharmacy and drug overdose - peak trop 3.66 in that setting, no ST/T changes on EKG - from [facility] notes cath at that time showed no significant disease, normal echo at the time.    2. HTN    3. Chronic pain syndrome  Past Medical History:  Diagnosis Date  . Arthritis    osteoarthritis  . Chronic pain   . GERD (gastroesophageal reflux disease)   . Hyperlipidemia   . Hypertension   . Lumbar disc disease    herniated disc  . MI (myocardial infarction) ([place])    [facility], No stents placed November 2016     Allergies  Allergen Reactions  . Codeine Itching  . Morphine And Related Itching  . Nsaids     itching     Current Outpatient Medications  Medication Sig Dispense Refill  . cetirizine (ZYRTEC) 10 MG tablet Take 10 mg by mouth daily.    . Cholecalciferol (VITAMIN D3) 5000 units CAPS Take 1 capsule (5,000 Units total) by mouth daily. 90 capsule 0  . dicyclomine (BENTYL) 10 MG capsule Take 10 mg by mouth 4 (four) times daily -  before meals and at bedtime. Reported on 01/24/2016    . DULoxetine (CYMBALTA) 60 MG capsule Take 60 mg by mouth daily.     Marland Kitchen esomeprazole (NEXIUM) 40 MG capsule Take 40 mg by mouth daily at 12 noon.    . fentaNYL (DURAGESIC - DOSED MCG/HR) 100 MCG/HR Reported on 01/24/2016    . fluticasone (FLONASE) 50 MCG/ACT nasal spray as needed.     . gabapentin (NEURONTIN) 300 MG capsule Take 300 mg by mouth 4 (four) times daily.    Marland Kitchen HYDROmorphone (DILAUDID) 4 MG tablet Reported on 01/24/2016    . isosorbide dinitrate (ISORDIL) 10 MG tablet Take 10 mg by mouth 3 (three) times daily.    .  Linaclotide (LINZESS) 290 MCG CAPS capsule Take 1 capsule (290 mcg total) by mouth daily. 30 capsule 6  . lisinopril (PRINIVIL,ZESTRIL) 10 MG tablet Take 10 mg by mouth daily.    . metoprolol (LOPRESSOR) 50 MG tablet Take 50 mg by mouth 2 (two) times daily.    . mirabegron ER (MYRBETRIQ) 50 MG TB24 tablet Take by mouth.    . rosuvastatin (CRESTOR) 5 MG tablet Take 5 mg by mouth daily.    . tapentadol (NUCYNTA) 50 MG TABS tablet Take 50 mg by mouth 3 (three) times daily.    . Teriparatide, Recombinant, 600 MCG/2.4ML SOLN Inject 20 mcg into the skin daily.    Marland Kitchen tiZANidine (ZANAFLEX) 4 MG capsule Take 4 mg by mouth 2 (two) times daily.    Marland Kitchen zolpidem (AMBIEN) 10 MG tablet Take 10 mg by mouth at bedtime as needed for sleep.     No current facility-administered medications for this visit.     Past Surgical History:  Procedure Laterality Date  . ABDOMINAL HYSTERECTOMY    . CARPAL TUNNEL RELEASE    . COLONOSCOPY WITH PROPOFOL N/A 01/30/2016   Procedure: COLONOSCOPY WITH PROPOFOL;  Surgeon: Lucilla Lame, MD;  Location: [place];  Service: Endoscopy;  Laterality: N/A;  .  ELBOW ARTHROPLASTY Left   . knee replacements    . POLYPECTOMY  01/30/2016   Procedure: POLYPECTOMY;  Surgeon: Lucilla Lame, MD;  Location: [place];  Service: Endoscopy;;  . SHOULDER SURGERY       Allergies  Allergen Reactions  . Codeine Itching  . Morphine And Related Itching  . Nsaids     itching      Family History  Problem Relation Age of Onset  . Cancer Mother   . Osteoporosis Mother   . Hypertension Father   . Heart attack Father   . Diabetes Sister   . Diabetes Brother      Social History Ms. Flavell reports that she has never smoked. She does not have any smokeless tobacco history on file. Ms. Knopf reports no history of alcohol use.   Review of Systems CONSTITUTIONAL: No weight loss, fever, chills, weakness or fatigue.  HEENT: Eyes: No visual loss, blurred vision, double  vision or yellow sclerae.No hearing loss, sneezing, congestion, runny nose or sore throat.  SKIN: No rash or itching.  CARDIOVASCULAR:  RESPIRATORY: No shortness of breath, cough or sputum.  GASTROINTESTINAL: No anorexia, nausea, vomiting or diarrhea. No abdominal pain or blood.  GENITOURINARY: No burning on urination, no polyuria NEUROLOGICAL: No headache, dizziness, syncope, paralysis, ataxia, numbness or tingling in the extremities. No change in bowel or bladder control.  MUSCULOSKELETAL: No muscle, back pain, joint pain or stiffness.  LYMPHATICS: No enlarged nodes. No history of splenectomy.  PSYCHIATRIC: No history of depression or anxiety.  ENDOCRINOLOGIC: No reports of sweating, cold or heat intolerance. No polyuria or polydipsia.  Marland Kitchen   Physical Examination There were no vitals filed for this visit. There were no vitals filed for this visit.  Gen: resting comfortably, no acute distress HEENT: no scleral icterus, pupils equal round and reactive, no palptable cervical adenopathy,  CV Resp: Clear to auscultation bilaterally GI: abdomen is soft, non-tender, non-distended, normal bowel sounds, no hepatosplenomegaly MSK: extremities are warm, no edema.  Skin: warm, no rash Neuro:  no focal deficits Psych: appropriate affect   Diagnostic Studies  Echo 66/0/6004 Mild LV systolic dysfunction (HT>97%) with mild LVH, trivial TR.    09/2016 monitor [facility] The patient was monitored for 167:27 hours, of which 166:24 hours were usable.  Average heart rate for the monitored period was 69 BPM.  Tachycardia was present for 2% of the readable data; Bradycardia was present for 9% of the readable data.  No Pause(s) noted of 3 seconds or longer.  18 manually-triggered recording(s) posted with symptoms including Rapid HR/Palp/Flutter, Dizziness/Lightheaded.  Symptoms correlated with sinus rhythm and rare PACs.   Assessment and Plan        Arnoldo Lenis, M.D., F.A.C.C.

## 2020-07-08 ENCOUNTER — Ambulatory Visit (INDEPENDENT_AMBULATORY_CARE_PROVIDER_SITE_OTHER): Payer: Medicare HMO | Admitting: Cardiology

## 2020-07-08 ENCOUNTER — Encounter: Payer: Self-pay | Admitting: Cardiology

## 2020-07-08 VITALS — BP 108/62 | HR 54 | Ht 66.0 in | Wt 217.0 lb

## 2020-07-08 DIAGNOSIS — R002 Palpitations: Secondary | ICD-10-CM

## 2020-07-08 DIAGNOSIS — R011 Cardiac murmur, unspecified: Secondary | ICD-10-CM

## 2020-07-08 DIAGNOSIS — I10 Essential (primary) hypertension: Secondary | ICD-10-CM | POA: Diagnosis not present

## 2020-07-08 NOTE — Progress Notes (Signed)
Clinical Summary Tina Morgan is a 69 y.o.female seen today as a new consult, referred by Dr Yong Channel for the following medical problems.    Previously followed at [facility] cardiology 1. Palpitations - previous monitor at 2017 [facility] showed symptoms correlated with SR and PACs  - ongoing palpitations, several times a day. Can occur at rest or with activity, more with activity. Symptoms last about 5 minutes. Occasional dizziness, SOB but rare - coffee very rare. No tea. Coke 2L bottle every 3 days. No energy drinks - symptoms have been progressing.      2. History of demand ischemia - from [facility] noted history of 2016 admission after being found unresponsive, unclear how long she was down. Ultimately treated for sepsis, respiratory failure requiring intubation.  - elevated trop at that time. Subsequent cath without significant disease    Past Medical History:  Diagnosis Date  . Arthritis    osteoarthritis  . Chronic pain   . GERD (gastroesophageal reflux disease)   . Hyperlipidemia   . Hypertension   . Lumbar disc disease    herniated disc  . MI (myocardial infarction) ([place])    [facility], No stents placed November 2016     Allergies  Allergen Reactions  . Codeine Itching  . Morphine And Related Itching  . Nsaids     itching     Current Outpatient Medications  Medication Sig Dispense Refill  . cetirizine (ZYRTEC) 10 MG tablet Take 10 mg by mouth daily.    . Cholecalciferol (VITAMIN D3) 5000 units CAPS Take 1 capsule (5,000 Units total) by mouth daily. 90 capsule 0  . dicyclomine (BENTYL) 10 MG capsule Take 10 mg by mouth 4 (four) times daily -  before meals and at bedtime. Reported on 01/24/2016    . DULoxetine (CYMBALTA) 60 MG capsule Take 60 mg by mouth daily.     Marland Kitchen esomeprazole (NEXIUM) 40 MG capsule Take 40 mg by mouth daily at 12 noon.    . fluticasone (FLONASE) 50 MCG/ACT nasal spray as needed.     . isosorbide dinitrate (ISORDIL) 10 MG tablet Take 10 mg by mouth 3  (three) times daily.    . Linaclotide (LINZESS) 290 MCG CAPS capsule Take 1 capsule (290 mcg total) by mouth daily. 30 capsule 6  . lisinopril (PRINIVIL,ZESTRIL) 10 MG tablet Take 10 mg by mouth daily.    . metoprolol (LOPRESSOR) 50 MG tablet Take 50 mg by mouth 2 (two) times daily.    . mirabegron ER (MYRBETRIQ) 50 MG TB24 tablet Take by mouth.    . rosuvastatin (CRESTOR) 5 MG tablet Take 5 mg by mouth daily.    . tapentadol (NUCYNTA) 50 MG TABS tablet Take 50 mg by mouth 3 (three) times daily.    . Teriparatide, Recombinant, 600 MCG/2.4ML SOLN Inject 20 mcg into the skin daily.    Marland Kitchen zolpidem (AMBIEN) 10 MG tablet Take 10 mg by mouth at bedtime as needed for sleep.     No current facility-administered medications for this visit.     Past Surgical History:  Procedure Laterality Date  . ABDOMINAL HYSTERECTOMY    . CARPAL TUNNEL RELEASE    . COLONOSCOPY WITH PROPOFOL N/A 01/30/2016   Procedure: COLONOSCOPY WITH PROPOFOL;  Surgeon: Lucilla Lame, MD;  Location: [facility];  Service: Endoscopy;  Laterality: N/A;  . ELBOW ARTHROPLASTY Left   . knee replacements    . POLYPECTOMY  01/30/2016   Procedure: POLYPECTOMY;  Surgeon: Lucilla Lame, MD;  Location: [place];  Service: Endoscopy;;  . SHOULDER SURGERY       Allergies  Allergen Reactions  . Codeine Itching  . Morphine And Related Itching  . Nsaids     itching      Family History  Problem Relation Age of Onset  . Cancer Mother   . Osteoporosis Mother   . Hypertension Father   . Heart attack Father   . Diabetes Sister   . Diabetes Brother      Social History Ms. Schloss reports that she has never smoked. She has never used smokeless tobacco. Ms. Osterman reports no history of alcohol use.   Review of Systems CONSTITUTIONAL: No weight loss, fever, chills, weakness or fatigue.  HEENT: Eyes: No visual loss, blurred vision, double vision or yellow sclerae.No hearing loss, sneezing, congestion, runny  nose or sore throat.  SKIN: No rash or itching.  CARDIOVASCULAR: per hpi RESPIRATORY: No shortness of breath, cough or sputum.  GASTROINTESTINAL: No anorexia, nausea, vomiting or diarrhea. No abdominal pain or blood.  GENITOURINARY: No burning on urination, no polyuria NEUROLOGICAL: No headache, dizziness, syncope, paralysis, ataxia, numbness or tingling in the extremities. No change in bowel or bladder control.  MUSCULOSKELETAL: No muscle, back pain, joint pain or stiffness.  LYMPHATICS: No enlarged nodes. No history of splenectomy.  PSYCHIATRIC: No history of depression or anxiety.  ENDOCRINOLOGIC: No reports of sweating, cold or heat intolerance. No polyuria or polydipsia.  Marland Kitchen   Physical Examination Vitals:   07/08/20 1120  BP: 108/62  Pulse: (!) 54  SpO2: 97%   Filed Weights   07/08/20 1120  Weight: 217 lb (98.4 kg)    Gen: resting comfortably, no acute distress HEENT: no scleral icterus, pupils equal round and reactive, no palptable cervical adenopathy,  CV: RRR, 3/6 systolic murmur rusb, no jvd Resp: Clear to auscultation bilaterally GI: abdomen is soft, non-tender, non-distended, normal bowel sounds, no hepatosplenomegaly MSK: extremities are warm, no edema.  Skin: warm, no rash Neuro:  no focal deficits Psych: appropriate affect      Assessment and Plan  1. Palpitations - progressing symptoms. Monitor in 2017 with mainly PACs. With progression of symptosm will repeat 1 week event monitor  2. Heart murmur - obtain echo to further evaluate  F/u 2 months     Arnoldo Lenis, M.D

## 2020-07-08 NOTE — Patient Instructions (Signed)
Your physician recommends that you schedule a follow-up appointment in: [place], NP  Your physician recommends that you continue on your current medications as directed. Please refer to the Current Medication list given to you today.  Your physician has recommended that you wear an event monitor FOR 7 DAYS. Event monitors are medical devices that record the heart's electrical activity. Doctors most often [place] these monitors to diagnose arrhythmias. Arrhythmias are problems with the speed or rhythm of the heartbeat. The monitor is a small, portable device. You can wear one while you do your normal daily activities. This is usually used to diagnose what is causing palpitations/syncope (passing out).  Your physician has requested that you have an echocardiogram. Echocardiography is a painless test that uses sound waves to create images of your heart. It provides your doctor with information about the size and shape of your heart and how well your heart's chambers and valves are working. This procedure takes approximately one hour. There are no restrictions for this procedure.  Thank you for choosing [facility]!!

## 2020-07-20 ENCOUNTER — Other Ambulatory Visit: Payer: Medicare HMO

## 2020-07-22 ENCOUNTER — Ambulatory Visit (INDEPENDENT_AMBULATORY_CARE_PROVIDER_SITE_OTHER): Payer: Medicare HMO

## 2020-07-22 DIAGNOSIS — R002 Palpitations: Secondary | ICD-10-CM

## 2020-08-04 ENCOUNTER — Ambulatory Visit (INDEPENDENT_AMBULATORY_CARE_PROVIDER_SITE_OTHER): Payer: Medicare HMO

## 2020-08-04 DIAGNOSIS — R011 Cardiac murmur, unspecified: Secondary | ICD-10-CM | POA: Diagnosis not present

## 2020-08-04 LAB — ECHOCARDIOGRAM COMPLETE
AR max vel: 2.26 cm2
AV Area VTI: 2.62 cm2
AV Area mean vel: 2.33 cm2
AV Mean grad: 14.5 mmHg
AV Peak grad: 21.6 mmHg
Ao pk vel: 2.33 m/s
Area-P 1/2: 2.54 cm2
P 1/2 time: 347 msec
S' Lateral: 2.58 cm

## 2020-08-23 ENCOUNTER — Telehealth: Payer: Self-pay | Admitting: Cardiology

## 2020-08-23 NOTE — Telephone Encounter (Signed)
-----   Message from Arnoldo Lenis, MD sent at 08/12/2020 10:11 AM EDT ----- Echo overall looks good, normal heart pumping function. Just some very mild age related muscle stiffness that is common and not concerning   Zandra Abts MD

## 2020-08-23 NOTE — Telephone Encounter (Signed)
Patient called requesting test results of recent echo.  °

## 2020-08-23 NOTE — Telephone Encounter (Signed)
Patient informed. Copy sent to PCP °

## 2020-09-16 ENCOUNTER — Telehealth: Payer: Self-pay | Admitting: *Deleted

## 2020-09-16 NOTE — Telephone Encounter (Signed)
Patient informed. Copy sent to PCP °

## 2020-09-16 NOTE — Telephone Encounter (Signed)
-----   Message from Arnoldo Lenis, MD sent at 09/14/2020  9:54 AM EDT ----- Normal heart monitor, will discuss at our upcoming f/u   J BrancH MD

## 2020-09-21 ENCOUNTER — Ambulatory Visit: Payer: Medicare HMO | Admitting: Cardiology

## 2020-10-05 NOTE — Progress Notes (Deleted)
Cardiology Office Note  Date: 10/05/2020   ID: Tina Morgan, DOB 20-May-1951, MRN [id number]  PCP:  Antionette Fairy, PA-C  Cardiologist:  Carlyle Dolly, MD Electrophysiologist:  None   Chief Complaint: Follow-up palpitations  History of Present Illness: Tina Morgan is a 69 y.o. female with a history of palpitations, HTN, HLD, history of demand ischemia.  Last encounter with Dr. Harl Bowie 2020-07-08 for palpitations.  She had previously been followed by [facility] cardiology.  Previous cardiac monitor 2017 showed symptoms correlated with sinus rhythm and PACs.  She was having ongoing palpitations several times a day.  Could occur at rest or with activity, more with activity.  History of demand ischemia from [facility] notes 2016.  Had been found unresponsive.  Ultimately treated for sepsis, respiratory failure requiring intubation.  Elevated troponin at that time.  Had a subsequent cardiac catheterization without significant disease.  Past Medical History:  Diagnosis Date  . Arthritis    osteoarthritis  . Chronic pain   . GERD (gastroesophageal reflux disease)   . Hyperlipidemia   . Hypertension   . Lumbar disc disease    herniated disc  . MI (myocardial infarction) ([place])    [facility], No stents placed November 2016    Past Surgical History:  Procedure Laterality Date  . ABDOMINAL HYSTERECTOMY    . CARPAL TUNNEL RELEASE    . COLONOSCOPY WITH PROPOFOL N/A 01/30/2016   Procedure: COLONOSCOPY WITH PROPOFOL;  Surgeon: Lucilla Lame, MD;  Location: [place];  Service: Endoscopy;  Laterality: N/A;  . ELBOW ARTHROPLASTY Left   . knee replacements    . POLYPECTOMY  01/30/2016   Procedure: POLYPECTOMY;  Surgeon: Lucilla Lame, MD;  Location: [place];  Service: Endoscopy;;  . SHOULDER SURGERY      Current Outpatient Medications  Medication Sig Dispense Refill  . cetirizine (ZYRTEC) 10 MG tablet Take 10 mg by mouth daily.    . Cholecalciferol (VITAMIN D3) 5000 units  CAPS Take 1 capsule (5,000 Units total) by mouth daily. 90 capsule 0  . dicyclomine (BENTYL) 10 MG capsule Take 10 mg by mouth 4 (four) times daily -  before meals and at bedtime. Reported on 01/24/2016    . DULoxetine (CYMBALTA) 60 MG capsule Take 60 mg by mouth daily.     Marland Kitchen esomeprazole (NEXIUM) 40 MG capsule Take 40 mg by mouth daily at 12 noon.    . fluticasone (FLONASE) 50 MCG/ACT nasal spray as needed.     . isosorbide dinitrate (ISORDIL) 10 MG tablet Take 10 mg by mouth 3 (three) times daily.    . Linaclotide (LINZESS) 290 MCG CAPS capsule Take 1 capsule (290 mcg total) by mouth daily. 30 capsule 6  . lisinopril (PRINIVIL,ZESTRIL) 10 MG tablet Take 10 mg by mouth daily.    . metoprolol (LOPRESSOR) 50 MG tablet Take 50 mg by mouth 2 (two) times daily.    . mirabegron ER (MYRBETRIQ) 50 MG TB24 tablet Take by mouth.    . rosuvastatin (CRESTOR) 5 MG tablet Take 5 mg by mouth daily.    . tapentadol (NUCYNTA) 50 MG TABS tablet Take 50 mg by mouth 3 (three) times daily.    . Teriparatide, Recombinant, 600 MCG/2.4ML SOLN Inject 20 mcg into the skin daily.    Marland Kitchen zolpidem (AMBIEN) 10 MG tablet Take 10 mg by mouth at bedtime as needed for sleep.     No current facility-administered medications for this visit.   Allergies:  Codeine, Morphine and related,  and Nsaids   Social History: The patient  reports that she has never smoked. She has never used smokeless tobacco. She reports that she does not drink alcohol and does not use drugs.   Family History: The patient's family history includes Cancer in her mother; Diabetes in her brother and sister; Heart attack in her father; Hypertension in her father; Osteoporosis in her mother.   ROS:  Please see the history of present illness. Otherwise, complete review of systems is positive for {NONE DEFAULTED:18576::"none"}.  All other systems are reviewed and negative.   Physical Exam: VS:  There were no vitals taken for this visit., BMI There is no height or  weight on file to calculate BMI.  Wt Readings from Last 3 Encounters:  07/08/20 217 lb (98.4 kg)  05/14/16 205 lb (93 kg)  04/23/16 202 lb (91.6 kg)    General: Patient appears comfortable at rest. HEENT: Conjunctiva and lids normal, oropharynx clear with moist mucosa. Neck: Supple, no elevated JVP or carotid bruits, no thyromegaly. Lungs: Clear to auscultation, nonlabored breathing at rest. Cardiac: Regular rate and rhythm, no S3 or significant systolic murmur, no pericardial rub. Abdomen: Soft, nontender, no hepatomegaly, bowel sounds present, no guarding or rebound. Extremities: No pitting edema, distal pulses 2+. Skin: Warm and dry. Musculoskeletal: No kyphosis. Neuropsychiatric: Alert and oriented x3, affect grossly appropriate.  ECG:  {EKG/Telemetry Strips Reviewed:[id number]}  Recent Labwork: No results found for requested labs within last 8760 hours.  No results found for: CHOL, TRIG, HDL, CHOLHDL, VLDL, LDLCALC, LDLDIRECT  Other Studies Reviewed Today:  Echocardiogram 2020-08-04  1. Left ventricular ejection fraction, by estimation, is 60 to 65%. The left ventricle has normal function. The left ventricle has no regional wall motion abnormalities. There is mild left ventricular hypertrophy. Left ventricular diastolic parameters are consistent with Grade I diastolic dysfunction (impaired relaxation). 2. Right ventricular systolic function was not well visualized. The right ventricular size is not well visualized. There is normal pulmonary artery systolic pressure. 3. The mitral valve was not well visualized. No evidence of mitral valve regurgitation. No evidence of mitral stenosis. 4. The aortic valve was not well visualized. Aortic valve regurgitation is trivial. Mild aortic valve stenosis.  Cardiac event monitor 2020-09-04  Study Highlights   7 day event monitor  Min HR 51, Max HR 114,Avg HR 68  No symptoms reported  No significant arrhythmias      Assessment and Plan:  1. Palpitations   2. Heart murmur    1. Palpitations ***  2. Heart murmur ***  Medication Adjustments/Labs and Tests Ordered: Current medicines are reviewed at length with the patient today.  Concerns regarding medicines are outlined above.   Disposition: Follow-up with ***  Signed, Levell July, NP 10/05/2020 10:11 PM    [place] at [facility], [place], [place] [address] Phone: [phone]; Fax: [phone]

## 2020-10-06 ENCOUNTER — Ambulatory Visit: Payer: Medicare HMO | Admitting: Family Medicine

## 2020-10-06 DIAGNOSIS — R011 Cardiac murmur, unspecified: Secondary | ICD-10-CM

## 2020-10-06 DIAGNOSIS — R002 Palpitations: Secondary | ICD-10-CM

## 2020-11-09 NOTE — Progress Notes (Addendum)
This is a telemedicine visit Patient location: Home Provider location: Office  Cardiology Office Note  Date: 11/10/2020   ID: Tina Morgan, DOB 1951/07/06, MRN [id number]  PCP:  Abran Richard, MD  Cardiologist:  Carlyle Dolly, MD Electrophysiologist:  None   Chief Complaint: Follow-up palpitations  History of Present Illness: Tina Morgan is a 69 y.o. female with a history of palpitations, GERD, hyperlipidemia, hypertension, MI 2016.  Last seen by Dr. Harl Bowie 07/08/2020.  Continued to have ongoing palpitations several times a day, could occur at rest or with activity lasting approximately 5 minutes.  Occasional dizziness, symptoms had been progressing.  History of demand ischemia with admission to [facility] 2016 after being found unresponsive.  Ultimately treated for sepsis and respiratory failure requiring intubation.  Elevated troponins at that time.  Subsequent cath with no CAD noted.  Echocardiogram was ordered for heart murmur and cardiac event monitor ordered for palpitations.  Spoke with patient by phone today.  She continues to complain of increased shortness of breath and increased fatigue.  States shortness of breath is more pronounced with exertional activity.  We went over the results of her recent echocardiogram and cardiac event monitor.  She admits to excessive snoring and excessive daytime sleepiness and fatigue.  Denies any classic anginal symptoms or associated nausea, vomiting, or diaphoresis.  States has been having a lot of diarrhea recently which is the reason she switched to virtual visit today.  Past Medical History:  Diagnosis Date  . Arthritis    osteoarthritis  . Chronic pain   . GERD (gastroesophageal reflux disease)   . Hyperlipidemia   . Hypertension   . Lumbar disc disease    herniated disc  . MI (myocardial infarction) ([place])    [facility], No stents placed November 2016    Past Surgical History:  Procedure Laterality Date  . ABDOMINAL  HYSTERECTOMY    . CARPAL TUNNEL RELEASE    . COLONOSCOPY WITH PROPOFOL N/A 01/30/2016   Procedure: COLONOSCOPY WITH PROPOFOL;  Surgeon: Lucilla Lame, MD;  Location: [place];  Service: Endoscopy;  Laterality: N/A;  . ELBOW ARTHROPLASTY Left   . knee replacements    . POLYPECTOMY  01/30/2016   Procedure: POLYPECTOMY;  Surgeon: Lucilla Lame, MD;  Location: [place];  Service: Endoscopy;;  . SHOULDER SURGERY      Current Outpatient Medications  Medication Sig Dispense Refill  . cetirizine (ZYRTEC) 10 MG tablet Take 10 mg by mouth daily.    . Cholecalciferol (VITAMIN D3) 5000 units CAPS Take 1 capsule (5,000 Units total) by mouth daily. 90 capsule 0  . dicyclomine (BENTYL) 10 MG capsule Take 10 mg by mouth 4 (four) times daily -  before meals and at bedtime. Reported on 01/24/2016    . DULoxetine (CYMBALTA) 60 MG capsule Take 60 mg by mouth daily.     Marland Kitchen esomeprazole (NEXIUM) 40 MG capsule Take 40 mg by mouth daily at 12 noon.    . fluticasone (FLONASE) 50 MCG/ACT nasal spray as needed.     . isosorbide dinitrate (ISORDIL) 10 MG tablet Take 10 mg by mouth 3 (three) times daily.    . Linaclotide (LINZESS) 290 MCG CAPS capsule Take 1 capsule (290 mcg total) by mouth daily. 30 capsule 6  . lisinopril (PRINIVIL,ZESTRIL) 10 MG tablet Take 10 mg by mouth daily.    . metoprolol (LOPRESSOR) 50 MG tablet Take 50 mg by mouth 2 (two) times daily.    . mirabegron ER (MYRBETRIQ)  50 MG TB24 tablet Take 50 mg by mouth daily.     . rosuvastatin (CRESTOR) 5 MG tablet Take 5 mg by mouth daily.    . tapentadol (NUCYNTA) 50 MG TABS tablet Take 50 mg by mouth 3 (three) times daily.    Marland Kitchen zolpidem (AMBIEN) 10 MG tablet Take 10 mg by mouth at bedtime as needed for sleep.    . Teriparatide, Recombinant, 600 MCG/2.4ML SOLN Inject 20 mcg into the skin daily. (Patient not taking: Reported on 11/10/2020)     No current facility-administered medications for this visit.   Allergies:  Codeine, Morphine and  related, and Nsaids   Social History: The patient  reports that she has never smoked. She has never used smokeless tobacco. She reports that she does not drink alcohol and does not use drugs.   Family History: The patient's family history includes Cancer in her mother; Diabetes in her brother and sister; Heart attack in her father; Hypertension in her father; Osteoporosis in her mother.   ROS:  Please see the history of present illness. Otherwise, complete review of systems is positive for none.  All other systems are reviewed and negative.   Physical Exam: VS:  Ht 5\' 4"  (1.626 m)   Wt 218 lb (98.9 kg)   BMI 37.42 kg/m , BMI Body mass index is 37.42 kg/m.  Wt Readings from Last 3 Encounters:  11/10/20 218 lb (98.9 kg)  07/08/20 217 lb (98.4 kg)  05/14/16 205 lb (93 kg)    Normal speech pattern, no evidence of dyspnea, wheezing or cough during telemedicine visit.   ECG: None today.  This was a virtual visit  Recent Labwork: No results found for requested labs within last 8760 hours.  No results found for: CHOL, TRIG, HDL, CHOLHDL, VLDL, LDLCALC, LDLDIRECT  Other Studies Reviewed Today:  Cardiac monitor 09/04/2020 Study Highlights   7 day event monitor  Min HR 51, Max HR 114,Avg HR 68  No symptoms reported  No significant arrhythmias      Echocardiogram 08/04/2020 1. Left ventricular ejection fraction, by estimation, is 60 to 65%. The left ventricle has normal function. The left ventricle has no regional wall motion abnormalities. There is mild left ventricular hypertrophy. Left ventricular diastolic parameters are consistent with Grade I diastolic dysfunction (impaired relaxation). 2. Right ventricular systolic function was not well visualized. The right ventricular size is not well visualized. There is normal pulmonary artery systolic pressure. 3. The mitral valve was not well visualized. No evidence of mitral valve regurgitation. No evidence of mitral  stenosis. 4. The aortic valve was not well visualized. Aortic valve regurgitation is trivial. Mild aortic valve stenosis.   Assessment and Plan:  1. Palpitations   2. Heart murmur   3. Dyspnea on exertion   4. Suspected sleep apnea    1. Palpitations States she still has palpitations but less prevalent since being on metoprolol.  2. Heart murmur Recent echocardiogram demonstrated EF of 60 to 65%.  Mild LVH, G1 DD.  Trivial aortic regurgitation mild aortic stenosis.  3.  Dyspnea on exertion Patient complains of progressively worsening dyspnea on exertion.  Denies any anginal symptoms such as neck, arm, back, or jaw pain denies any nausea, vomiting, or diaphoresis.  This could be anginal equivalent.  Please obtain a Lexiscan stress test.  4.  Suspected sleep apnea Patient states she has been having significant fatigue and admits to snoring with excessive daytime sleepiness and fatigue.  Please refer to pulmonary for possible home  sleep study.  Patient states an in-hospital sleep study would not be appropriate for her because she does not believe she be able to sleep.   Medication Adjustments/Labs and Tests Ordered: Current medicines are reviewed at length with the patient today.  Concerns regarding medicines are outlined above.    Time spent with patient 15 minutes during telemedicine visit.  Disposition: Follow-up with Dr. Harl Bowie or APP 6 to 8 weeks after sleep study and Lexiscan stress test  Signed, Levell July, NP 11/10/2020 10:56 AM    [facility] at [place], [place], [place] [address] Phone: [phone]; Fax: [phone]

## 2020-11-10 ENCOUNTER — Encounter: Payer: Self-pay | Admitting: Family Medicine

## 2020-11-10 ENCOUNTER — Encounter: Payer: Self-pay | Admitting: *Deleted

## 2020-11-10 ENCOUNTER — Telehealth: Payer: Self-pay | Admitting: Family Medicine

## 2020-11-10 ENCOUNTER — Telehealth (INDEPENDENT_AMBULATORY_CARE_PROVIDER_SITE_OTHER): Payer: Medicare HMO | Admitting: Family Medicine

## 2020-11-10 VITALS — Ht 64.0 in | Wt 218.0 lb

## 2020-11-10 DIAGNOSIS — R002 Palpitations: Secondary | ICD-10-CM

## 2020-11-10 DIAGNOSIS — R0600 Dyspnea, unspecified: Secondary | ICD-10-CM

## 2020-11-10 DIAGNOSIS — R011 Cardiac murmur, unspecified: Secondary | ICD-10-CM

## 2020-11-10 DIAGNOSIS — R0609 Other forms of dyspnea: Secondary | ICD-10-CM

## 2020-11-10 DIAGNOSIS — R29818 Other symptoms and signs involving the nervous system: Secondary | ICD-10-CM

## 2020-11-10 NOTE — Patient Instructions (Signed)
Medication Instructions:  Continue all current medications.  Labwork: none  Testing/Procedures:  Your physician has requested that you have a lexiscan myoview. For further information please visit [email or web address]. Please follow instruction sheet, as given.  Office will contact with results via phone or letter.    Follow-Up: 6-8 weeks   Any Other Special Instructions Will Be Listed Below (If Applicable). You have been referred to:  Sleep study   If you need a refill on your cardiac medications before your next appointment, please call your pharmacy.

## 2020-11-10 NOTE — Telephone Encounter (Signed)
  Patient Consent for Virtual Visit         Tina Morgan has provided verbal consent on 11/10/2020 for a virtual visit (video or telephone).   CONSENT FOR VIRTUAL VISIT FOR:  Tina Morgan  By participating in this virtual visit I agree to the following:  I hereby voluntarily request, consent and authorize [facility] and its employed or contracted physicians, physician assistants, nurse practitioners or other licensed health care professionals (the Practitioner), to provide me with telemedicine health care services (the "Services") as deemed necessary by the treating Practitioner. I acknowledge and consent to receive the Services by the Practitioner via telemedicine. I understand that the telemedicine visit will involve communicating with the Practitioner through live audiovisual communication technology and the disclosure of certain medical information by electronic transmission. I acknowledge that I have been given the opportunity to request an in-person assessment or other available alternative prior to the telemedicine visit and am voluntarily participating in the telemedicine visit.  I understand that I have the right to withhold or withdraw my consent to the use of telemedicine in the course of my care at any time, without affecting my right to future care or treatment, and that the Practitioner or I may terminate the telemedicine visit at any time. I understand that I have the right to inspect all information obtained and/or recorded in the course of the telemedicine visit and may receive copies of available information for a reasonable fee.  I understand that some of the potential risks of receiving the Services via telemedicine include:  Marland Kitchen Delay or interruption in medical evaluation due to technological equipment failure or disruption; . Information transmitted may not be sufficient (e.g. poor resolution of images) to allow for appropriate medical decision making by the  Practitioner; and/or  . In rare instances, security protocols could fail, causing a breach of personal health information.  Furthermore, I acknowledge that it is my responsibility to provide information about my medical history, conditions and care that is complete and accurate to the best of my ability. I acknowledge that Practitioner's advice, recommendations, and/or decision may be based on factors not within their control, such as incomplete or inaccurate data provided by me or distortions of diagnostic images or specimens that may result from electronic transmissions. I understand that the practice of medicine is not an exact science and that Practitioner makes no warranties or guarantees regarding treatment outcomes. I acknowledge that a copy of this consent can be made available to me via my patient portal ([place]), or I can request a printed copy by calling the office of [facility].    I understand that my insurance will be billed for this visit.   I have read or had this consent read to me. . I understand the contents of this consent, which adequately explains the benefits and risks of the Services being provided via telemedicine.  . I have been provided ample opportunity to ask questions regarding this consent and the Services and have had my questions answered to my satisfaction. . I give my informed consent for the services to be provided through the use of telemedicine in my medical care

## 2020-11-10 NOTE — Addendum Note (Signed)
Addended by: Laurine Blazer on: 11/10/2020 04:25 PM   Modules accepted: Orders

## 2020-11-22 ENCOUNTER — Ambulatory Visit (HOSPITAL_COMMUNITY): Payer: Medicare HMO

## 2020-11-22 ENCOUNTER — Encounter (HOSPITAL_COMMUNITY): Payer: Medicare HMO

## 2020-12-28 ENCOUNTER — Institutional Professional Consult (permissible substitution): Payer: Medicare HMO | Admitting: Pulmonary Disease

## 2020-12-30 ENCOUNTER — Ambulatory Visit: Payer: Medicare HMO | Admitting: Cardiology

## 2020-12-30 NOTE — Progress Notes (Deleted)
Clinical Summary Tina Morgan is a 70 y.o.female  Previously followed at [facility] cardiology 1. Palpitations - previous monitor at 2017 [facility] showed symptoms correlated with SR and PACs  - ongoing palpitations, several times a day. Can occur at rest or with activity, more with activity. Symptoms last about 5 minutes. Occasional dizziness, SOB but rare - coffee very rare. No tea. Coke 2L bottle every 3 days. No energy drinks - symptoms have been progressing.   06/2020 event monitor benign    2. History of demand ischemia - from [facility] noted history of 10/2015 admission after being found unresponsive, unclear how long she was down. Ultimately treated for sepsis, respiratory failure requiring intubation.  - elevated trop at that time. Subsequent cath without significant disease   3. DOE - recent benign echo - lexiscan ordered at last visit with PA but not completed    Past Medical History:  Diagnosis Date  . Arthritis    osteoarthritis  . Chronic pain   . GERD (gastroesophageal reflux disease)   . Hyperlipidemia   . Hypertension   . Lumbar disc disease    herniated disc  . MI (myocardial infarction) ([place])    [facility], No stents placed November 2016     Allergies  Allergen Reactions  . Codeine Itching  . Morphine And Related Itching  . Nsaids     itching     Current Outpatient Medications  Medication Sig Dispense Refill  . cetirizine (ZYRTEC) 10 MG tablet Take 10 mg by mouth daily.    . Cholecalciferol (VITAMIN D3) 5000 units CAPS Take 1 capsule (5,000 Units total) by mouth daily. 90 capsule 0  . dicyclomine (BENTYL) 10 MG capsule Take 10 mg by mouth 4 (four) times daily -  before meals and at bedtime. Reported on 01/24/2016    . DULoxetine (CYMBALTA) 60 MG capsule Take 60 mg by mouth daily.     Marland Kitchen esomeprazole (NEXIUM) 40 MG capsule Take 40 mg by mouth daily at 12 noon.    . fluticasone (FLONASE) 50 MCG/ACT nasal spray as needed.     . isosorbide dinitrate  (ISORDIL) 10 MG tablet Take 10 mg by mouth 3 (three) times daily.    . Linaclotide (LINZESS) 290 MCG CAPS capsule Take 1 capsule (290 mcg total) by mouth daily. 30 capsule 6  . lisinopril (PRINIVIL,ZESTRIL) 10 MG tablet Take 10 mg by mouth daily.    . metoprolol (LOPRESSOR) 50 MG tablet Take 50 mg by mouth 2 (two) times daily.    . mirabegron ER (MYRBETRIQ) 50 MG TB24 tablet Take 50 mg by mouth daily.     . rosuvastatin (CRESTOR) 5 MG tablet Take 5 mg by mouth daily.    . tapentadol (NUCYNTA) 50 MG TABS tablet Take 50 mg by mouth 3 (three) times daily.    . Teriparatide, Recombinant, 600 MCG/2.4ML SOLN Inject 20 mcg into the skin daily. (Patient not taking: Reported on 11/10/2020)    . zolpidem (AMBIEN) 10 MG tablet Take 10 mg by mouth at bedtime as needed for sleep.     No current facility-administered medications for this visit.     Past Surgical History:  Procedure Laterality Date  . ABDOMINAL HYSTERECTOMY    . CARPAL TUNNEL RELEASE    . COLONOSCOPY WITH PROPOFOL N/A 01/30/2016   Procedure: COLONOSCOPY WITH PROPOFOL;  Surgeon: Lucilla Lame, MD;  Location: [place];  Service: Endoscopy;  Laterality: N/A;  . ELBOW ARTHROPLASTY Left   . knee replacements    .  POLYPECTOMY  01/30/2016   Procedure: POLYPECTOMY;  Surgeon: Lucilla Lame, MD;  Location: [place];  Service: Endoscopy;;  . SHOULDER SURGERY       Allergies  Allergen Reactions  . Codeine Itching  . Morphine And Related Itching  . Nsaids     itching      Family History  Problem Relation Age of Onset  . Cancer Mother   . Osteoporosis Mother   . Hypertension Father   . Heart attack Father   . Diabetes Sister   . Diabetes Brother      Social History Tina Morgan reports that she has never smoked. She has never used smokeless tobacco. Tina Morgan reports no history of alcohol use.   Review of Systems CONSTITUTIONAL: No weight loss, fever, chills, weakness or fatigue.  HEENT: Eyes: No  visual loss, blurred vision, double vision or yellow sclerae.No hearing loss, sneezing, congestion, runny nose or sore throat.  SKIN: No rash or itching.  CARDIOVASCULAR:  RESPIRATORY: No shortness of breath, cough or sputum.  GASTROINTESTINAL: No anorexia, nausea, vomiting or diarrhea. No abdominal pain or blood.  GENITOURINARY: No burning on urination, no polyuria NEUROLOGICAL: No headache, dizziness, syncope, paralysis, ataxia, numbness or tingling in the extremities. No change in bowel or bladder control.  MUSCULOSKELETAL: No muscle, back pain, joint pain or stiffness.  LYMPHATICS: No enlarged nodes. No history of splenectomy.  PSYCHIATRIC: No history of depression or anxiety.  ENDOCRINOLOGIC: No reports of sweating, cold or heat intolerance. No polyuria or polydipsia.  Marland Kitchen   Physical Examination There were no vitals filed for this visit. There were no vitals filed for this visit.  Gen: resting comfortably, no acute distress HEENT: no scleral icterus, pupils equal round and reactive, no palptable cervical adenopathy,  CV Resp: Clear to auscultation bilaterally GI: abdomen is soft, non-tender, non-distended, normal bowel sounds, no hepatosplenomegaly MSK: extremities are warm, no edema.  Skin: warm, no rash Neuro:  no focal deficits Psych: appropriate affect   Diagnostic Studies  06/2020 heart monitor   7 day event monitor  Min HR 51, Max HR 114,Avg HR 68  No symptoms reported  No significant arrhythmias   07/2020 echo IMPRESSIONS    1. Left ventricular ejection fraction, by estimation, is 60 to 65%. The  left ventricle has normal function. The left ventricle has no regional  wall motion abnormalities. There is mild left ventricular hypertrophy.  Left ventricular diastolic parameters  are consistent with Grade I diastolic dysfunction (impaired relaxation).  2. Right ventricular systolic function was not well visualized. The right  ventricular size is not well  visualized. There is normal pulmonary artery  systolic pressure.  3. The mitral valve was not well visualized. No evidence of mitral valve  regurgitation. No evidence of mitral stenosis.  4. The aortic valve was not well visualized. Aortic valve regurgitation  is trivial. Mild aortic valve stenosis.   10/2015 cath [facility]   Assessment and Plan  1. Palpitations - progressing symptoms. Monitor in 2017 with mainly PACs. With progression of symptosm will repeat 1 week event monitor  2. Heart murmur - obtain echo to further evaluate  F/u 2 months      Arnoldo Lenis, M.D.

## 2021-01-13 ENCOUNTER — Other Ambulatory Visit: Payer: Self-pay

## 2021-01-13 ENCOUNTER — Telehealth: Payer: Self-pay | Admitting: Cardiology

## 2021-01-13 ENCOUNTER — Ambulatory Visit (INDEPENDENT_AMBULATORY_CARE_PROVIDER_SITE_OTHER): Payer: Medicare HMO | Admitting: Cardiology

## 2021-01-13 ENCOUNTER — Encounter: Payer: Self-pay | Admitting: *Deleted

## 2021-01-13 ENCOUNTER — Encounter: Payer: Self-pay | Admitting: Cardiology

## 2021-01-13 VITALS — BP 144/80 | HR 72 | Ht 66.0 in | Wt 239.4 lb

## 2021-01-13 DIAGNOSIS — Z01810 Encounter for preprocedural cardiovascular examination: Secondary | ICD-10-CM | POA: Diagnosis not present

## 2021-01-13 DIAGNOSIS — R002 Palpitations: Secondary | ICD-10-CM | POA: Diagnosis not present

## 2021-01-13 DIAGNOSIS — R0602 Shortness of breath: Secondary | ICD-10-CM

## 2021-01-13 NOTE — Addendum Note (Signed)
Addended by: Merlene Laughter on: 01/13/2021 04:19 PM   Modules accepted: Orders

## 2021-01-13 NOTE — Progress Notes (Addendum)
Clinical Summary Tina Morgan is a 70 y.o.female  Previously followed at [facility] cardiology 1. Palpitations - previous monitor at 2017 [facility] showed symptoms correlated with SR and PACs  - ongoing palpitations, several times a day. Can occur at rest or with activity, more with activity. Symptoms last about 5 minutes. Occasional dizziness, SOB but rare - coffee very rare. No tea. Coke 2L bottle every 3 days. No energy drinks - symptoms have been progressing.    06/2020 event monitor: no significant arrhythmias     2. History of demand ischemia - from [facility] noted history of 2016 admission after being found unresponsive, unclear how long she was down. Ultimately treated for sepsis, respiratory failure requiring intubation.  - elevated trop at that time. Subsequent cath without significant disease  3. Aortic stenosis - 07/2020 echo: LVEF 60-65%, no WMAs, mean grad 14.5 mmHg, AVA VTI 2.6  4. DOE - stress test was ordered last visit with PA Leonides Sake, not completed - has not had stress due transportation  - ongoign DOE, overall stable. DOE just doing light house chores - no chest pains.  - DOE with just 1/2 flight upstairs   5. Preoperative evaluation - considering hernia surgery - DOE with <4 METs   Past Medical History:  Diagnosis Date  . Arthritis    osteoarthritis  . Chronic pain   . GERD (gastroesophageal reflux disease)   . Hyperlipidemia   . Hypertension   . Lumbar disc disease    herniated disc  . MI (myocardial infarction) ([place])    [facility], No stents placed November 2016     Allergies  Allergen Reactions  . Codeine Itching  . Morphine And Related Itching  . Nsaids     itching     Current Outpatient Medications  Medication Sig Dispense Refill  . cetirizine (ZYRTEC) 10 MG tablet Take 10 mg by mouth daily.    . Cholecalciferol (VITAMIN D3) 5000 units CAPS Take 1 capsule (5,000 Units total) by mouth daily. 90 capsule 0  . dicyclomine (BENTYL) 10 MG  capsule Take 10 mg by mouth 4 (four) times daily -  before meals and at bedtime. Reported on 01/24/2016    . DULoxetine (CYMBALTA) 60 MG capsule Take 60 mg by mouth daily.     Marland Kitchen esomeprazole (NEXIUM) 40 MG capsule Take 40 mg by mouth daily at 12 noon.    . fluticasone (FLONASE) 50 MCG/ACT nasal spray as needed.     . isosorbide dinitrate (ISORDIL) 10 MG tablet Take 10 mg by mouth 3 (three) times daily.    . Linaclotide (LINZESS) 290 MCG CAPS capsule Take 1 capsule (290 mcg total) by mouth daily. 30 capsule 6  . lisinopril (PRINIVIL,ZESTRIL) 10 MG tablet Take 10 mg by mouth daily.    . metoprolol (LOPRESSOR) 50 MG tablet Take 50 mg by mouth 2 (two) times daily.    . mirabegron ER (MYRBETRIQ) 50 MG TB24 tablet Take 50 mg by mouth daily.     . rosuvastatin (CRESTOR) 5 MG tablet Take 5 mg by mouth daily.    . tapentadol (NUCYNTA) 50 MG TABS tablet Take 50 mg by mouth 3 (three) times daily.    . Teriparatide, Recombinant, 600 MCG/2.4ML SOLN Inject 20 mcg into the skin daily. (Patient not taking: Reported on 11/10/2020)    . zolpidem (AMBIEN) 10 MG tablet Take 10 mg by mouth at bedtime as needed for sleep.     No current facility-administered medications for this visit.  Past Surgical History:  Procedure Laterality Date  . ABDOMINAL HYSTERECTOMY    . CARPAL TUNNEL RELEASE    . COLONOSCOPY WITH PROPOFOL N/A 01/30/2016   Procedure: COLONOSCOPY WITH PROPOFOL;  Surgeon: Lucilla Lame, MD;  Location: [place];  Service: Endoscopy;  Laterality: N/A;  . ELBOW ARTHROPLASTY Left   . knee replacements    . POLYPECTOMY  01/30/2016   Procedure: POLYPECTOMY;  Surgeon: Lucilla Lame, MD;  Location: [place];  Service: Endoscopy;;  . SHOULDER SURGERY       Allergies  Allergen Reactions  . Codeine Itching  . Morphine And Related Itching  . Nsaids     itching      Family History  Problem Relation Age of Onset  . Cancer Mother   . Osteoporosis Mother   . Hypertension Father   .  Heart attack Father   . Diabetes Sister   . Diabetes Brother      Social History Tina Morgan reports that she has never smoked. She has never used smokeless tobacco. Tina Morgan reports no history of alcohol use.   Review of Systems CONSTITUTIONAL: No weight loss, fever, chills, weakness or fatigue.  HEENT: Eyes: No visual loss, blurred vision, double vision or yellow sclerae.No hearing loss, sneezing, congestion, runny nose or sore throat.  SKIN: No rash or itching.  CARDIOVASCULAR: per hpi RESPIRATORY: No shortness of breath, cough or sputum.  GASTROINTESTINAL: No anorexia, nausea, vomiting or diarrhea. No abdominal pain or blood.  GENITOURINARY: No burning on urination, no polyuria NEUROLOGICAL: No headache, dizziness, syncope, paralysis, ataxia, numbness or tingling in the extremities. No change in bowel or bladder control.  MUSCULOSKELETAL: No muscle, back pain, joint pain or stiffness.  LYMPHATICS: No enlarged nodes. No history of splenectomy.  PSYCHIATRIC: No history of depression or anxiety.  ENDOCRINOLOGIC: No reports of sweating, cold or heat intolerance. No polyuria or polydipsia.  Marland Kitchen   Physical Examination Today's Vitals   01/13/21 1519  BP: (!) 144/80  Pulse: 72  SpO2: 97%  Weight: 239 lb 6.4 oz (108.6 kg)  Height: 5\' 6"  (1.676 m)   Body mass index is 38.64 kg/m.  Gen: resting comfortably, no acute distress HEENT: no scleral icterus, pupils equal round and reactive, no palptable cervical adenopathy,  CV: RRR, no m/r/g, no jvd Resp: Clear to auscultation bilaterally GI: abdomen is soft, non-tender, non-distended, normal bowel sounds, no hepatosplenomegaly MSK: extremities are warm, no edema.  Skin: warm, no rash Neuro:  no focal deficits Psych: appropriate affect   Diagnostic Studies  06/2020 event monitor  7 day event monitor  Min HR 51, Max HR 114,Avg HR 68  No symptoms reported  No significant arrhythmias   07/2020 echo 1. Left  ventricular ejection fraction, by estimation, is 60 to 65%. The  left ventricle has normal function. The left ventricle has no regional  wall motion abnormalities. There is mild left ventricular hypertrophy.  Left ventricular diastolic parameters  are consistent with Grade I diastolic dysfunction (impaired relaxation).  2. Right ventricular systolic function was not well visualized. The right  ventricular size is not well visualized. There is normal pulmonary artery  systolic pressure.  3. The mitral valve was not well visualized. No evidence of mitral valve  regurgitation. No evidence of mitral stenosis.  4. The aortic valve was not well visualized. Aortic valve regurgitation  is trivial. Mild aortic valve stenosis.      Assessment and Plan  1. DOE - unclear etiology, recent benign echo - obtain  lexiscan to further evaluate EKG today shows SR, nonspecific ST/T changes  2. Preoperative evaluation - considering hernia surgery - recent DOE. Does not tolerate over 4 METs - will need lexiscan for further risk stratification.    01/30/21 addendum: Low risk stress test, recommend proceeding with surgery as planned   Antoine Poche, M.D.

## 2021-01-13 NOTE — Patient Instructions (Signed)
Your physician wants you to follow-up in: 6 MONTHS WITH DR BRANCH You will receive a reminder letter in the mail two months in advance. If you don't receive a letter, please call our office to schedule the follow-up appointment.  Your physician recommends that you continue on your current medications as directed. Please refer to the Current Medication list given to you today.  Your physician has requested that you have a lexiscan myoview. For further information please visit [email or web address]. Please follow instruction sheet, as given.   Thank you for choosing [facility]!!   

## 2021-01-13 NOTE — Telephone Encounter (Signed)
Pre-cert Verification for the following procedure    LEXISCAN MYOVIEW  DATE:01/18/2021  LOCATION:[place]

## 2021-01-18 ENCOUNTER — Encounter (HOSPITAL_COMMUNITY): Payer: Self-pay

## 2021-01-18 ENCOUNTER — Other Ambulatory Visit: Payer: Self-pay

## 2021-01-18 ENCOUNTER — Ambulatory Visit (HOSPITAL_COMMUNITY)
Admission: RE | Admit: 2021-01-18 | Discharge: 2021-01-18 | Disposition: A | Discharge status: Home / Self Care | Payer: Medicare HMO | Source: Ambulatory Visit | Attending: Cardiology | Admitting: Cardiology

## 2021-01-18 ENCOUNTER — Encounter (HOSPITAL_COMMUNITY)
Admission: RE | Admit: 2021-01-18 | Discharge: 2021-01-18 | Disposition: A | Discharge status: Home / Self Care | Payer: Medicare HMO | Source: Ambulatory Visit | Attending: Cardiology | Admitting: Cardiology

## 2021-01-18 DIAGNOSIS — R0602 Shortness of breath: Secondary | ICD-10-CM | POA: Insufficient documentation

## 2021-01-18 LAB — NM MYOCAR MULTI W/SPECT W/WALL MOTION / EF
LV dias vol: 65 mL (ref 46–106)
LV sys vol: 14 mL
Peak HR: 73 {beats}/min
RATE: 0.74
Rest HR: 53 {beats}/min
SDS: 5
SRS: 2
SSS: 7
TID: 0.93

## 2021-01-18 MED ORDER — REGADENOSON 0.4 MG/5ML IV SOLN
INTRAVENOUS | Status: AC
  Administered 2021-01-18: 0.4 mg via INTRAVENOUS
  Filled 2021-01-18: qty 5

## 2021-01-18 MED ORDER — SODIUM CHLORIDE FLUSH 0.9 % IV SOLN
INTRAVENOUS | Status: AC
  Administered 2021-01-18: 10 mL via INTRAVENOUS
  Filled 2021-01-18: qty 10

## 2021-01-18 MED ORDER — TECHNETIUM TC 99M TETROFOSMIN IV KIT
30.0000 | PACK | Freq: Once | INTRAVENOUS | Status: AC | PRN
  Administered 2021-01-18: 32.5 via INTRAVENOUS

## 2021-01-18 MED ORDER — TECHNETIUM TC 99M TETROFOSMIN IV KIT
10.0000 | PACK | Freq: Once | INTRAVENOUS | Status: AC | PRN
  Administered 2021-01-18: 10.8 via INTRAVENOUS

## 2021-01-27 ENCOUNTER — Telehealth: Payer: Self-pay | Admitting: Cardiology

## 2021-01-27 NOTE — Telephone Encounter (Signed)
Stress test done 1/28

## 2021-01-27 NOTE — Telephone Encounter (Signed)
Patient called requesting test results of recent stress test.   °

## 2021-01-30 NOTE — Telephone Encounter (Signed)
Pt voiced understanding    Overall stress test looks fine, no evidence of significant blockages, overall low risk. Nothing to explain her symptoms or limit her plans for surgery.   Zandra Abts MD

## 2021-02-21 ENCOUNTER — Encounter: Payer: Self-pay | Admitting: Pulmonary Disease

## 2021-02-21 ENCOUNTER — Ambulatory Visit (INDEPENDENT_AMBULATORY_CARE_PROVIDER_SITE_OTHER): Payer: Medicare HMO | Admitting: Pulmonary Disease

## 2021-02-21 ENCOUNTER — Other Ambulatory Visit (HOSPITAL_COMMUNITY)
Admission: RE | Admit: 2021-02-21 | Discharge: 2021-02-21 | Disposition: A | Discharge status: Home / Self Care | Payer: Medicare HMO | Source: Ambulatory Visit | Attending: Pulmonary Disease | Admitting: Pulmonary Disease

## 2021-02-21 ENCOUNTER — Ambulatory Visit (HOSPITAL_COMMUNITY)
Admission: RE | Admit: 2021-02-21 | Discharge: 2021-02-21 | Disposition: A | Discharge status: Home / Self Care | Payer: Medicare HMO | Source: Ambulatory Visit | Attending: Pulmonary Disease | Admitting: Pulmonary Disease

## 2021-02-21 ENCOUNTER — Other Ambulatory Visit: Payer: Self-pay

## 2021-02-21 ENCOUNTER — Encounter: Payer: Self-pay | Admitting: *Deleted

## 2021-02-21 VITALS — BP 160/98 | HR 70 | Temp 97.2°F | Ht 65.0 in | Wt 242.8 lb

## 2021-02-21 DIAGNOSIS — R0600 Dyspnea, unspecified: Secondary | ICD-10-CM | POA: Insufficient documentation

## 2021-02-21 DIAGNOSIS — R0683 Snoring: Secondary | ICD-10-CM

## 2021-02-21 DIAGNOSIS — R0609 Other forms of dyspnea: Secondary | ICD-10-CM

## 2021-02-21 LAB — COMPREHENSIVE METABOLIC PANEL
ALT: 22 U/L (ref 0–44)
AST: 23 U/L (ref 15–41)
Albumin: 4 g/dL (ref 3.5–5.0)
Alkaline Phosphatase: 75 U/L (ref 38–126)
Anion gap: 11 (ref 5–15)
BUN: 16 mg/dL (ref 8–23)
CO2: 25 mmol/L (ref 22–32)
Calcium: 9.3 mg/dL (ref 8.9–10.3)
Chloride: 104 mmol/L (ref 98–111)
Creatinine, Ser: 0.58 mg/dL (ref 0.44–1.00)
GFR, Estimated: >60 mL/min (ref >60)
Glucose, Bld: 121 mg/dL — ABNORMAL HIGH (ref 70–99)
Potassium: 4.2 mmol/L (ref 3.5–5.1)
Sodium: 140 mmol/L (ref 135–145)
Total Bilirubin: 0.2 mg/dL — ABNORMAL LOW (ref 0.3–1.2)
Total Protein: 7.2 g/dL (ref 6.5–8.1)

## 2021-02-21 LAB — CBC WITH DIFFERENTIAL/PLATELET
Abs Immature Granulocytes: 0.04 10*3/uL (ref 0.00–0.07)
Basophils Absolute: 0.1 10*3/uL (ref 0.0–0.1)
Basophils Relative: 1 %
Eosinophils Absolute: 0.3 10*3/uL (ref 0.0–0.5)
Eosinophils Relative: 3 %
HCT: 42.6 % (ref 36.0–46.0)
Hemoglobin: 13.8 g/dL (ref 12.0–15.0)
Immature Granulocytes: 1 %
Lymphocytes Relative: 17 %
Lymphs Abs: 1.4 10*3/uL (ref 0.7–4.0)
MCH: 30.9 pg (ref 26.0–34.0)
MCHC: 32.4 g/dL (ref 30.0–36.0)
MCV: 95.3 fL (ref 80.0–100.0)
Monocytes Absolute: 0.7 10*3/uL (ref 0.1–1.0)
Monocytes Relative: 8 %
Neutro Abs: 6.1 10*3/uL (ref 1.7–7.7)
Neutrophils Relative %: 70 %
Platelets: 243 10*3/uL (ref 150–400)
RBC: 4.47 MIL/uL (ref 3.87–5.11)
RDW: 13.4 % (ref 11.5–15.5)
WBC: 8.6 10*3/uL (ref 4.0–10.5)
nRBC: 0 % (ref 0.0–0.2)

## 2021-02-21 NOTE — Progress Notes (Signed)
[facility]  Chief Complaint  Patient presents with  . Consult    Sleep consul, shortness of breath with activity for past year    Constitutional:  BP (!) 160/98 (BP Location: Left Arm, Cuff Size: Normal)   Pulse 70   Temp (!) 97.2 F (36.2 C) (Other (Comment)) Comment (Src): wrist  Ht 5\' 5"  (1.651 m)   Wt 242 lb 12.8 oz (110.1 kg)   SpO2 96% Comment: Room air  BMI 40.40 kg/m   Past Medical History:  OA, Chronic pain, GERD, HLD, HTN, CAD  Past Surgical History:  She  has a past surgical history that includes knee replacements; Shoulder surgery; Carpal tunnel release; Abdominal hysterectomy; Elbow Arthroplasty (Left); Colonoscopy with propofol (N/A, 01/30/2016); and polypectomy (01/30/2016).  Brief Summary:  Tina Morgan is a 70 y.o. female with fatigue and dyspnea.      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  She is on disability because of these.  She was previously on fentanyl patch.  She reports having to transferred to [facility] by life flight because of having a heart attack and she was told this was from wearing fentanyl patch.  She also thinks she had COVID infection in early 2020 but never was tested then.  After these events she developed dyspnea.  This is mostly with exertion, but can happen at rest also.  She never smoked, but her parents were heavy smokers.  She has intermittent cough.  She had previous allergy testing - told she has allergies to mold, grass, dust, trees.  Gets GI upset from taking aspirin.  No prior history of pneumonia.  She has pet cats.  She has trouble falling asleep and staying asleep.  She snores and her breathing gets shallow at night.  She wakes up frequently to use the bathroom.  Uses ambien to help sleep.  Epworth score is 4 out of 24.  She has ventral hernia and being assessed by surgery for repair.  Physical Exam:    Appearance - well kempt   ENMT - no sinus tenderness, no oral exudate, no LAN, Mallampati 3 airway, no stridor, edentulous  Respiratory - equal breath sounds bilaterally, no wheezing or rales  CV - s1s2 regular rate and rhythm, no murmurs  Ext - no clubbing, no edema, reducible ventral hernia  Skin - no rashes  Psych - normal mood and affect   Pulmonary Tests:    Sleep Tests:    Cardiac Tests:   Echo 08/04/20 >> EF 60 to 65%, grade 1 DD, mild AS  Social History:  She  reports that she has never smoked. She has never used smokeless tobacco. She reports that she does not drink alcohol and does not use drugs.  Family History:  Her family history includes Cancer in her mother; Diabetes in her brother and sister; Heart attack in her father; Hypertension in her father; Osteoporosis in her mother.    Discussion:  She has dyspnea on exertion.  She has history of second hand tobacco exposure.  She reports history of seasonal allergies and has persistent cough since having a respiratory infection in early 2020 (?COVID).  She has multiple orthopedic issues which have limited her exercise ability, and likely contributes to deconditioning.   She has snoring, sleep disruption, apnea, and daytime sleepiness.  She has history of hypertension and CAD.  Her BMI is > 35.  I am concerned she could have obstructive sleep apnea.  Assessment/Plan:   Dyspnea on exertion. - will arrange for PFT, chest xray, CBC with diff, and CMET to further assess  Snoring with excessive daytime sleepiness. - will need to arrange for a home sleep study  Coronary artery disease, mild aortic stenosis. - followed by Dr. Carlyle Dolly with [facility]  Ventral hernia. - followed by Dr. Riley Lam with surgery in [place], [place] - will determine her pulmonary fitness for surgery after her tests are complete  Obesity. - discussed how weight can impact sleep and risk for sleep disordered breathing -  discussed options to assist with weight loss: combination of diet modification, cardiovascular and strength training exercises  Cardiovascular risk. - had an extensive discussion regarding the adverse health consequences related to untreated sleep disordered breathing - specifically discussed the risks for hypertension, coronary artery disease, cardiac dysrhythmias, cerebrovascular disease, and diabetes - lifestyle modification discussed  Safe driving practices. - discussed how sleep disruption can increase risk of accidents, particularly when driving - safe driving practices were discussed  Therapies for obstructive sleep apnea. - if the sleep study shows significant sleep apnea, then various therapies for treatment were reviewed: CPAP, oral appliance, and surgical interventions  Time Spent Involved in Patient Care on Day of Examination:  49 minutes  Follow up:  Patient Instructions  Lab tests and chest xray today  Will arrange for pulmonary function test to be done in [place] office or [facility] - which ever can be scheduled first  Will arrange for home sleep study  Follow up in 4 to 6 weeks either in [place] or [place] office - will try to schedule your pulmonary function test for the same day as your follow up appointment   Medication List:   Allergies as of 02/21/2021      Reactions   Codeine Itching   Morphine And Related Itching   Nsaids    itching      Medication List       Accurate as of February 21, 2021 10:52 AM. If you have any questions, ask your nurse or doctor.        STOP taking these medications   tapentadol 50 MG tablet Commonly known as: NUCYNTA Stopped by: Chesley Mires, MD   Teriparatide (Recombinant) 600 MCG/2.4ML Soln Stopped by: Chesley Mires, MD     TAKE these medications   acetaminophen 500 MG tablet Commonly known as: TYLENOL Take 500 mg by mouth daily.   amLODipine 5 MG tablet Commonly known as: NORVASC Take 5 mg by  mouth daily.   cetirizine 10 MG tablet Commonly known as: ZYRTEC Take 10 mg by mouth daily.   dicyclomine 10 MG capsule Commonly known as: BENTYL Take 10 mg by mouth 4 (four) times daily -  before meals and at bedtime. Reported on 01/24/2016   DULoxetine 60 MG capsule Commonly known as: CYMBALTA Take 60 mg by mouth daily. Takes 2 capsules daily   esomeprazole 40 MG capsule Commonly known as: NEXIUM Take 40 mg by mouth daily at 12 noon.   fluticasone 50 MCG/ACT nasal spray Commonly known as: FLONASE as needed.   isosorbide dinitrate 10 MG tablet Commonly known as: ISORDIL Take 10 mg by mouth 3 (three) times daily.   linaclotide 290 MCG Caps capsule Commonly known as: LINZESS Take 1 capsule (290 mcg total) by mouth daily.   lisinopril 10 MG tablet Commonly known as: ZESTRIL Take 10 mg by mouth daily. Takes  20mg    meloxicam 15 MG tablet Commonly known as: MOBIC Take 15 mg by mouth daily.   metoprolol tartrate 50 MG tablet Commonly known as: LOPRESSOR Take 50 mg by mouth 2 (two) times daily.   mirabegron ER 50 MG Tb24 tablet Commonly known as: MYRBETRIQ Take 50 mg by mouth daily.   rosuvastatin 5 MG tablet Commonly known as: CRESTOR Take 5 mg by mouth daily.   TYLENOL PM EXTRA STRENGTH PO Take by mouth. Takes 2 capsules two to three times per week   Vitamin D3 125 MCG (5000 UT) Caps Take 1 capsule (5,000 Units total) by mouth daily.   zolpidem 10 MG tablet Commonly known as: AMBIEN Take 10 mg by mouth at bedtime as needed for sleep.       Signature:  Chesley Mires, MD [place] Pager - [phone] 02/21/2021, 10:52 AM

## 2021-02-21 NOTE — Patient Instructions (Signed)
Lab tests and chest xray today  Will arrange for pulmonary function test to be done in [place] office or [facility] - which ever can be scheduled first  Will arrange for home sleep study  Follow up in 4 to 6 weeks either in [place] or [place] office - will try to schedule your pulmonary function test for the same day as your follow up appointment

## 2021-02-23 ENCOUNTER — Encounter: Payer: Self-pay | Admitting: *Deleted

## 2021-02-25 ENCOUNTER — Other Ambulatory Visit (HOSPITAL_COMMUNITY)
Admission: RE | Admit: 2021-02-25 | Discharge: 2021-02-25 | Disposition: A | Discharge status: Home / Self Care | Payer: Medicare HMO | Source: Ambulatory Visit | Attending: Pulmonary Disease | Admitting: Pulmonary Disease

## 2021-02-25 DIAGNOSIS — Z20822 Contact with and (suspected) exposure to covid-19: Secondary | ICD-10-CM | POA: Insufficient documentation

## 2021-02-25 LAB — SARS CORONAVIRUS 2 (TAT 6-24 HRS): SARS Coronavirus 2: NEGATIVE

## 2021-03-22 ENCOUNTER — Encounter: Payer: Self-pay | Admitting: Pulmonary Disease

## 2021-03-22 ENCOUNTER — Other Ambulatory Visit: Payer: Self-pay

## 2021-03-22 ENCOUNTER — Ambulatory Visit (INDEPENDENT_AMBULATORY_CARE_PROVIDER_SITE_OTHER): Payer: Medicare HMO | Admitting: Pulmonary Disease

## 2021-03-22 VITALS — BP 146/86 | HR 61 | Temp 97.6°F | Ht 65.0 in | Wt 240.2 lb

## 2021-03-22 DIAGNOSIS — R0683 Snoring: Secondary | ICD-10-CM

## 2021-03-22 DIAGNOSIS — R0600 Dyspnea, unspecified: Secondary | ICD-10-CM

## 2021-03-22 DIAGNOSIS — R0609 Other forms of dyspnea: Secondary | ICD-10-CM

## 2021-03-22 MED ORDER — ALBUTEROL SULFATE HFA 108 (90 BASE) MCG/ACT IN AERS: 2.0000 | INHALATION_SPRAY | Freq: Four times a day (QID) | RESPIRATORY_TRACT | 5 refills | Status: DC | PRN

## 2021-03-22 MED ORDER — ARNUITY ELLIPTA 100 MCG/ACT IN AEPB: 1.0000 | INHALATION_SPRAY | Freq: Every day | RESPIRATORY_TRACT | 5 refills | Status: DC

## 2021-03-22 NOTE — Patient Instructions (Signed)
Arnuity one puff daily, and rinse your mouth after each use  Albuterol two puffs every 4 to 6 hours as needed for cough, wheeze, or chest congestion  Will reschedule home sleep study  Follow up in 4 weeks

## 2021-03-22 NOTE — Progress Notes (Signed)
Pulmonary, Critical Care, and Sleep Medicine  Chief Complaint  Patient presents with  . Follow-up    Both non and productive cough with tan-ish brown phlegm    Constitutional:  BP (!) 146/86 (BP Location: Left Arm, Cuff Size: Normal)   Pulse 61   Temp 97.6 F (36.4 C) (Other (Comment)) Comment (Src): wrist  Ht 5\' 5"  (1.651 m)   Wt 240 lb 3.2 oz (109 kg)   SpO2 97% Comment: Room air  BMI 39.97 kg/m   Past Medical History:  OA, Chronic pain, GERD, HLD, HTN, CAD  Past Surgical History:  She  has a past surgical history that includes knee replacements; Shoulder surgery; Carpal tunnel release; Abdominal hysterectomy; Elbow Arthroplasty (Left); Colonoscopy with propofol (N/A, 01/30/2016); and polypectomy (01/30/2016).  Brief Summary:  Tina Morgan is a 70 y.o. female with fatigue and dyspnea.      Subjective:   She had chest xray on 02/22/21 and this was negative.  Lab work from 02/21/21 was normal except for mild elevation blood sugar.  Her grandson' car broke, and she wasn't able to get a ride to do her PFT.  She had similar issue with getting home sleep study set up.    She is still having intermittent cough during the day and night.  She gets winded with activity (sweeping the floor, doing laundry).    She has noticed her ventral hernia getting bigger.  Physical Exam:   Appearance - well kempt   ENMT - no sinus tenderness, no oral exudate, no LAN, Mallampati 3 airway, no stridor  Respiratory - equal breath sounds bilaterally, no wheezing or rales  CV - s1s2 regular rate and rhythm, no murmurs  Ext - no clubbing, no edema  Skin - no rashes  Psych - normal mood and affect   Pulmonary Tests:    Sleep Tests:    Cardiac Tests:   Echo 08/04/20 >> EF 60 to 65%, grade 1 DD, mild AS  Social History:  She  reports that she has never smoked. She has never used smokeless tobacco. She reports that she does not drink alcohol and does not use  drugs.  Family History:  Her family history includes Cancer in her mother; Diabetes in her brother and sister; Heart attack in her father; Hypertension in her father; Osteoporosis in her mother.     Assessment/Plan:   Dyspnea on exertion. - concern she might have asthma - will have her try arnuity and prn albuterol; inhaler technique demonstrated - will defer PFT testing for now, and reassess after she has surgery  Snoring with excessive daytime sleepiness. - discussed concern that her nocturnal coughing spells could be from sleep apnea with arousals, and this could impact her recover when she has ventral hernia repair - will reschedule home sleep study and then determine if she needs CPAP set up  Coronary artery disease, mild aortic stenosis. - followed by Dr. Carlyle Dolly with [facility]  Ventral hernia. - followed by Dr. Riley Lam with surgery in [place], [place] - will determine her pulmonary fitness for surgery after her tests are complete  Obesity. - discussed how weight can impact sleep and risk for sleep disordered breathing - discussed options to assist with weight loss: combination of diet modification, cardiovascular and strength training exercises  Time Spent Involved in Patient Care on Day of Examination:  23 minutes  Follow up:  Patient Instructions  Arnuity one puff daily, and rinse your mouth after each use  Albuterol two puffs every 4 to 6 hours as needed for cough, wheeze, or chest congestion  Will reschedule home sleep study  Follow up in 4 weeks   Medication List:   Allergies as of 03/22/2021      Reactions   Codeine Itching   Morphine And Related Itching   Naproxen    Other reaction(s): rash   Nsaids    itching      Medication List       Accurate as of March 22, 2021 12:31 PM. If you have any questions, ask your nurse or doctor.        acetaminophen 500 MG tablet Commonly known as: TYLENOL Take 500 mg by mouth daily.    albuterol 108 (90 Base) MCG/ACT inhaler Commonly known as: VENTOLIN HFA Inhale 2 puffs into the lungs every 6 (six) hours as needed for wheezing or shortness of breath. Started by: Chesley Mires, MD   amLODipine 5 MG tablet Commonly known as: NORVASC Take 5 mg by mouth daily.   Arnuity Ellipta 100 MCG/ACT Aepb Generic drug: Fluticasone Furoate Inhale 1 puff into the lungs daily. Started by: Chesley Mires, MD   cetirizine 10 MG tablet Commonly known as: ZYRTEC Take 10 mg by mouth daily.   dicyclomine 10 MG capsule Commonly known as: BENTYL Take 10 mg by mouth 4 (four) times daily -  before meals and at bedtime. Reported on 01/24/2016   DULoxetine 60 MG capsule Commonly known as: CYMBALTA Take 60 mg by mouth daily. Takes 2 capsules daily   esomeprazole 40 MG capsule Commonly known as: NEXIUM Take 40 mg by mouth daily at 12 noon.   fluticasone 50 MCG/ACT nasal spray Commonly known as: FLONASE as needed.   isosorbide dinitrate 10 MG tablet Commonly known as: ISORDIL Take 10 mg by mouth 3 (three) times daily.   linaclotide 290 MCG Caps capsule Commonly known as: LINZESS Take 1 capsule (290 mcg total) by mouth daily.   lisinopril 10 MG tablet Commonly known as: ZESTRIL Take 10 mg by mouth daily. Takes 20mg    meloxicam 15 MG tablet Commonly known as: MOBIC Take 15 mg by mouth daily.   metoprolol tartrate 50 MG tablet Commonly known as: LOPRESSOR Take 50 mg by mouth 2 (two) times daily.   mirabegron ER 50 MG Tb24 tablet Commonly known as: MYRBETRIQ Take 50 mg by mouth daily.   rosuvastatin 5 MG tablet Commonly known as: CRESTOR Take 5 mg by mouth daily.   TYLENOL PM EXTRA STRENGTH PO Take by mouth. Takes 2 capsules two to three times per week   Vitamin D3 125 MCG (5000 UT) Caps Take 1 capsule (5,000 Units total) by mouth daily.   zolpidem 10 MG tablet Commonly known as: AMBIEN Take 10 mg by mouth at bedtime as needed for sleep.       Signature:  Chesley Mires, MD [place] Pager - [phone] 03/22/2021, 12:31 PM

## 2021-03-27 ENCOUNTER — Telehealth: Payer: Self-pay | Admitting: Pulmonary Disease

## 2021-03-27 NOTE — Telephone Encounter (Signed)
She is being assessed by surgery for ventral hernia repair.  At her last visit I explained that she would need to have home sleep study done prior to having her surgery.  Explained that if she has untreated sleep apnea, then she could get a coughing spell at night and this could cause her hernia to recur if she had surgery now.  She needs to reschedule her home sleep study.

## 2021-03-27 NOTE — Telephone Encounter (Signed)
Called and spoke with patient who states that she needs to cancel HST pickup and said that Sood told her he would call her [place] [facility] doctor to okay her for surgery and she said that she just spoke with that office and they have not heard from [place]. call if you have any questions   Dr. Halford Chessman please advise

## 2021-03-27 NOTE — Telephone Encounter (Signed)
Called and spoke with pt and she misunderstood what VS had said at her OV with him.  She thought he wanted her to have the surgery and then have the HST.     Pt will need to be rescheduled for the HST ASAP as she is needing to have the hernia surgery.  Pt stated that she will not be able to come in on Wednesday of this week.  PCC's please reschedule with pt.  Thanks

## 2021-03-28 NOTE — Telephone Encounter (Signed)
Tina Morgan is calling the patient to get this scheduled.  This is a [place] patient.

## 2021-04-06 ENCOUNTER — Telehealth: Payer: Self-pay | Admitting: Pulmonary Disease

## 2021-04-06 NOTE — Telephone Encounter (Signed)
Working on this one.  It is a [place] patient.

## 2021-04-06 NOTE — Telephone Encounter (Signed)
Pt scheduled on Monday, 4/18 @ 4:30 in [place].

## 2021-04-10 ENCOUNTER — Ambulatory Visit: Payer: Medicare HMO

## 2021-04-10 ENCOUNTER — Other Ambulatory Visit: Payer: Self-pay

## 2021-04-10 DIAGNOSIS — R0683 Snoring: Secondary | ICD-10-CM

## 2021-04-10 DIAGNOSIS — G4733 Obstructive sleep apnea (adult) (pediatric): Secondary | ICD-10-CM | POA: Diagnosis not present

## 2021-04-13 ENCOUNTER — Telehealth: Payer: Self-pay | Admitting: Pulmonary Disease

## 2021-04-13 DIAGNOSIS — G4733 Obstructive sleep apnea (adult) (pediatric): Secondary | ICD-10-CM | POA: Diagnosis not present

## 2021-04-13 NOTE — Telephone Encounter (Signed)
HST 04/10/21 >> AHI 9.1, SpO2 low 82%  Please inform her that her sleep study shows mild obstructive sleep apnea.  Please arrange for ROV with me or NP to discuss treatment options.

## 2021-04-13 NOTE — Telephone Encounter (Signed)
Called and spoke with patient. OV is scheduled for 5/24 at 1130. Patient states she needed to get clearance for her hernia surgery as well.   Nothing further needed at this time.

## 2021-04-20 ENCOUNTER — Other Ambulatory Visit: Payer: Self-pay

## 2021-04-20 ENCOUNTER — Ambulatory Visit: Payer: Medicare HMO | Admitting: Pulmonary Disease

## 2021-04-20 ENCOUNTER — Encounter: Payer: Self-pay | Admitting: Pulmonary Disease

## 2021-04-20 ENCOUNTER — Ambulatory Visit (INDEPENDENT_AMBULATORY_CARE_PROVIDER_SITE_OTHER): Payer: Medicare HMO | Admitting: Pulmonary Disease

## 2021-04-20 VITALS — BP 144/88 | HR 53 | Temp 97.0°F | Ht 65.0 in | Wt 234.4 lb

## 2021-04-20 DIAGNOSIS — R0600 Dyspnea, unspecified: Secondary | ICD-10-CM | POA: Diagnosis not present

## 2021-04-20 DIAGNOSIS — R0609 Other forms of dyspnea: Secondary | ICD-10-CM

## 2021-04-20 DIAGNOSIS — G4733 Obstructive sleep apnea (adult) (pediatric): Secondary | ICD-10-CM | POA: Diagnosis not present

## 2021-04-20 DIAGNOSIS — Z7189 Other specified counseling: Secondary | ICD-10-CM | POA: Diagnosis not present

## 2021-04-20 NOTE — Progress Notes (Signed)
[facility], [facility]  Chief Complaint  Patient presents with  . Follow-up    Go over HST results and tx options..Shortness of breath with activity which patients states not different from last visit    Constitutional:  BP (!) 144/88 (BP Location: Left Arm, Cuff Size: Normal)   Pulse (!) 53   Temp (!) 97 F (36.1 C) (Other (Comment)) Comment (Src): wrist  Ht 5\' 5"  (1.651 m)   Wt 234 lb 6.4 oz (106.3 kg)   SpO2 98% Comment: Room air  BMI 39.01 kg/m   Past Medical History:  OA, Chronic pain, GERD, HLD, HTN, CAD  Past Surgical History:  She  has a past surgical history that includes knee replacements; Shoulder surgery; Carpal tunnel release; Abdominal hysterectomy; Elbow Arthroplasty (Left); Colonoscopy with propofol (N/A, 01/30/2016); and polypectomy (01/30/2016).  Brief Summary:  Tina Morgan is a 70 y.o. female with fatigue and dyspnea.      Subjective:   She had home sleep study showed mild sleep apnea.  Tried using arnuity.  No improvement.  Wasn't able to get PFT done yet.  Has tried albuterol with minimal improvement.  Her hernia is causing more trouble.  She is getting nauseous and having trouble keeping food down.  Physical Exam:   Appearance - well kempt   ENMT - no sinus tenderness, no oral exudate, no LAN, Mallampati 3 airway, no stridor  Respiratory - equal breath sounds bilaterally, no wheezing or rales  CV - s1s2 regular rate and rhythm, no murmurs  Ext - no clubbing, no edema  Skin - no rashes  Psych - normal mood and affect   Pulmonary Tests:    Sleep Tests:   HST 04/10/21 >> AHI 9.1, SpO2 low 82%  Cardiac Tests:   Echo 08/04/20 >> EF 60 to 65%, grade 1 DD, mild AS  Social History:  She  reports that she has never smoked. She has never used smokeless tobacco. She reports that she does not drink alcohol and does not use drugs.  Family History:  Her family history includes Cancer in her mother;  Diabetes in her brother and sister; Heart attack in her father; Hypertension in her father; Osteoporosis in her mother.     Assessment/Plan:   Dyspnea on exertion. - no improvement with asthma therapy - likely related to obesity and deconditioning - defer PFT for now and reassess after she has surgery - she can stop arnuity - continue albuterol prn  Obstructive sleep apnea. - reviewed her sleep study - discussed how untreated sleep apnea can impact her health - treatment options reviewed - will arrange for auto CPAP 5-15 cm H2O  Coronary artery disease, mild aortic stenosis. - followed by Dr. Carlyle Dolly with [facility]  Ventral hernia. - followed by Dr. Riley Lam with surgery in [place], [place] - no pulmonary contraindications for her to have surgery - she should be continued on auto CPAP therapy in the post operative period  Obesity. - discussed how weight can impact sleep and risk for sleep disordered breathing - discussed options to assist with weight loss: combination of diet modification, cardiovascular and strength training exercises  Time Spent Involved in Patient Care on Day of Examination:  24 minutes  Follow up:  Patient Instructions  Okay to stop using arnuity  Can use albuterol every 4 to 6 hours as needed for coughing, wheeze, chest congestion or shortness of breath  Will arrange for auto CPAP set up  Follow up in 3 to 4 months   Medication List:   Allergies as of 04/20/2021      Reactions   Codeine Itching   Morphine And Related Itching   Naproxen    Other reaction(s): rash   Nsaids    itching      Medication List       Accurate as of April 20, 2021 11:47 AM. If you have any questions, ask your nurse or doctor.        STOP taking these medications   Arnuity Ellipta 100 MCG/ACT Aepb Generic drug: Fluticasone Furoate Stopped by: Chesley Mires, MD     TAKE these medications   acetaminophen 500 MG tablet Commonly known as:  TYLENOL Take 500 mg by mouth daily.   albuterol 108 (90 Base) MCG/ACT inhaler Commonly known as: VENTOLIN HFA Inhale 2 puffs into the lungs every 6 (six) hours as needed for wheezing or shortness of breath.   amLODipine 5 MG tablet Commonly known as: NORVASC Take 5 mg by mouth daily.   cetirizine 10 MG tablet Commonly known as: ZYRTEC Take 10 mg by mouth daily.   dicyclomine 10 MG capsule Commonly known as: BENTYL Take 10 mg by mouth 4 (four) times daily -  before meals and at bedtime. Reported on 01/24/2016   DULoxetine 60 MG capsule Commonly known as: CYMBALTA Take 60 mg by mouth daily. Takes 2 capsules daily   esomeprazole 40 MG capsule Commonly known as: NEXIUM Take 40 mg by mouth daily at 12 noon.   fluticasone 50 MCG/ACT nasal spray Commonly known as: FLONASE as needed.   isosorbide dinitrate 10 MG tablet Commonly known as: ISORDIL Take 10 mg by mouth 3 (three) times daily.   linaclotide 290 MCG Caps capsule Commonly known as: LINZESS Take 1 capsule (290 mcg total) by mouth daily.   lisinopril 10 MG tablet Commonly known as: ZESTRIL Take 10 mg by mouth daily. Takes 20mg    meloxicam 15 MG tablet Commonly known as: MOBIC Take 15 mg by mouth daily.   metoprolol tartrate 50 MG tablet Commonly known as: LOPRESSOR Take 50 mg by mouth 2 (two) times daily.   mirabegron ER 50 MG Tb24 tablet Commonly known as: MYRBETRIQ Take 50 mg by mouth daily.   rosuvastatin 5 MG tablet Commonly known as: CRESTOR Take 5 mg by mouth daily.   TYLENOL PM EXTRA STRENGTH PO Take by mouth. Takes 2 capsules two to three times per week   Vitamin D3 125 MCG (5000 UT) Caps Take 1 capsule (5,000 Units total) by mouth daily.   zolpidem 10 MG tablet Commonly known as: AMBIEN Take 10 mg by mouth at bedtime as needed for sleep.       Signature:  Chesley Mires, MD [place] Pager - [phone] 04/20/2021, 11:47 AM

## 2021-04-20 NOTE — Patient Instructions (Signed)
Okay to stop using arnuity  Can use albuterol every 4 to 6 hours as needed for coughing, wheeze, chest congestion or shortness of breath  Will arrange for auto CPAP set up  Follow up in 3 to 4 months

## 2021-04-25 ENCOUNTER — Telehealth: Payer: Self-pay | Admitting: Pulmonary Disease

## 2021-04-25 NOTE — Telephone Encounter (Signed)
Last OV note has been faxed to [facility].  Nothing further is needed.

## 2021-05-15 ENCOUNTER — Telehealth: Payer: Self-pay | Admitting: Pulmonary Disease

## 2021-05-15 DIAGNOSIS — R0609 Other forms of dyspnea: Secondary | ICD-10-CM

## 2021-05-15 DIAGNOSIS — R0600 Dyspnea, unspecified: Secondary | ICD-10-CM
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
## 2021-05-16 ENCOUNTER — Ambulatory Visit: Payer: Medicare HMO | Admitting: Pulmonary Disease

## 2021-10-31 ENCOUNTER — Ambulatory Visit: Payer: 59

## 2021-10-31 ENCOUNTER — Other Ambulatory Visit: Payer: Self-pay | Admitting: Family Medicine

## 2021-10-31 ENCOUNTER — Encounter: Payer: Self-pay | Admitting: Family Medicine

## 2021-10-31 ENCOUNTER — Ambulatory Visit (INDEPENDENT_AMBULATORY_CARE_PROVIDER_SITE_OTHER): Payer: 59 | Admitting: Family Medicine

## 2021-10-31 ENCOUNTER — Telehealth: Payer: Self-pay | Admitting: Family Medicine

## 2021-10-31 VITALS — BP 138/82 | HR 69 | Ht 65.0 in | Wt 236.0 lb

## 2021-10-31 DIAGNOSIS — R29818 Other symptoms and signs involving the nervous system: Secondary | ICD-10-CM

## 2021-10-31 DIAGNOSIS — I10 Essential (primary) hypertension: Secondary | ICD-10-CM

## 2021-10-31 DIAGNOSIS — R002 Palpitations: Secondary | ICD-10-CM

## 2021-10-31 DIAGNOSIS — R0609 Other forms of dyspnea: Secondary | ICD-10-CM

## 2021-10-31 DIAGNOSIS — R011 Cardiac murmur, unspecified: Secondary | ICD-10-CM | POA: Diagnosis not present

## 2021-10-31 MED ORDER — LISINOPRIL 20 MG PO TABS: 20.0000 mg | ORAL_TABLET | Freq: Every day | ORAL | 6 refills | Status: DC

## 2021-10-31 NOTE — Progress Notes (Signed)
Cardiology Office Note  Date: 11/01/2021   ID: Tina Morgan, DOB [birth year], MRN [id number]  PCP:  Abran Richard, MD  Cardiologist:  Carlyle Dolly, MD Electrophysiologist:  None   Chief Complaint: 6 month follow up  History of Present Illness: Tina Morgan is a 70 y.o. female with a history of palpitations, GERD, hyperlipidemia, hypertension, MI 2016.  Last seen by Dr. Harl Bowie 07/08/2020.  Continued to have ongoing palpitations several times a day, could occur at rest or with activity lasting approximately 5 minutes.  Occasional dizziness, symptoms had been progressing.  History of demand ischemia with admission to [facility] 2016 after being found unresponsive.  Ultimately treated for sepsis and respiratory failure requiring intubation.  Elevated troponins at that time.  Subsequent cath with no CAD noted.  Echocardiogram was ordered for heart murmur and cardiac event monitor ordered for palpitations.  She is here for 6 month follow up. She complains of increased blood pressures with complaints of frequent headaches as well as chest pain mostly when sitting but some increase with activity at times. with some associated dizziness and sweating at times. Also complaining of fluttering sensation in her chest at times. Current cardiac regimen includes; Amlodipine 5 mg po daily, Lisinopril 20 mg po daily, Metoprolol 50 mg po daily, Crestor 5 mg po daily.   Past Medical History:  Diagnosis Date   Arthritis    osteoarthritis   Chronic pain    GERD (gastroesophageal reflux disease)    Hyperlipidemia    Hypertension    Lumbar disc disease    herniated disc   MI (myocardial infarction) ([place])    [facility], No stents placed November 2016    Past Surgical History:  Procedure Laterality Date   ABDOMINAL HYSTERECTOMY     CARPAL TUNNEL RELEASE     COLONOSCOPY WITH PROPOFOL N/A 01/30/2016   Procedure: COLONOSCOPY WITH PROPOFOL;  Surgeon: Lucilla Lame, MD;  Location: [facility];   Service: Endoscopy;  Laterality: N/A;   ELBOW ARTHROPLASTY Left    knee replacements     POLYPECTOMY  01/30/2016   Procedure: POLYPECTOMY;  Surgeon: Lucilla Lame, MD;  Location: [facility];  Service: Endoscopy;;   SHOULDER SURGERY      Current Outpatient Medications  Medication Sig Dispense Refill   acetaminophen (TYLENOL) 500 MG tablet Take 500 mg by mouth daily.     albuterol (VENTOLIN HFA) 108 (90 Base) MCG/ACT inhaler Inhale 2 puffs into the lungs every 6 (six) hours as needed for wheezing or shortness of breath. 8 g 5   cetirizine (ZYRTEC) 10 MG tablet Take 10 mg by mouth daily.     Cholecalciferol (VITAMIN D3) 5000 units CAPS Take 1 capsule (5,000 Units total) by mouth daily. 90 capsule 0   dicyclomine (BENTYL) 10 MG capsule Take 10 mg by mouth 4 (four) times daily -  before meals and at bedtime. Reported on 01/24/2016     diphenhydrAMINE-APAP, sleep, (TYLENOL PM EXTRA STRENGTH PO) Take by mouth. Takes 2 capsules two to three times per week     DULoxetine (CYMBALTA) 60 MG capsule Take 60 mg by mouth daily. Takes 2 capsules daily     esomeprazole (NEXIUM) 40 MG capsule Take 40 mg by mouth daily at 12 noon.     fluticasone (FLONASE) 50 MCG/ACT nasal spray as needed.      isosorbide dinitrate (ISORDIL) 10 MG tablet Take 10 mg by mouth 3 (three) times daily.     Linaclotide (LINZESS) 290 MCG CAPS capsule  Take 1 capsule (290 mcg total) by mouth daily. 30 capsule 6   meloxicam (MOBIC) 15 MG tablet Take 15 mg by mouth daily.     metoprolol (LOPRESSOR) 50 MG tablet Take 50 mg by mouth 2 (two) times daily.     mirabegron ER (MYRBETRIQ) 50 MG TB24 tablet Take 50 mg by mouth daily.      rosuvastatin (CRESTOR) 5 MG tablet Take 5 mg by mouth daily.     zolpidem (AMBIEN) 10 MG tablet Take 10 mg by mouth at bedtime as needed for sleep.     amLODipine (NORVASC) 10 MG tablet Take 1 tablet (10 mg total) by mouth daily. 90 tablet 1   lisinopril (ZESTRIL) 40 MG tablet Take 40 mg by mouth every  morning.     No current facility-administered medications for this visit.   Allergies:  Codeine, Morphine and related, Naproxen, and Nsaids   Social History: The patient  reports that she has never smoked. She has never used smokeless tobacco. She reports that she does not drink alcohol and does not use drugs.   Family History: The patient's family history includes Cancer in her mother; Diabetes in her brother and sister; Heart attack in her father; Hypertension in her father; Osteoporosis in her mother.   ROS:  Please see the history of present illness. Otherwise, complete review of systems is positive for none.  All other systems are reviewed and negative.   Physical Exam: VS:  BP 138/82   Pulse 69   Ht 5\' 5"  (1.651 m)   Wt 236 lb (107 kg)   SpO2 96%   BMI 39.27 kg/m , BMI Body mass index is 39.27 kg/m.  Wt Readings from Last 3 Encounters:  10/31/21 236 lb (107 kg)  04/20/21 234 lb 6.4 oz (106.3 kg)  03/22/21 240 lb 3.2 oz (109 kg)    General: Patient appears comfortable at rest. HEENT: Conjunctiva and lids normal, oropharynx clear with moist mucosa. Neck: Supple, no elevated JVP or carotid bruits, no thyromegaly. Lungs: Clear to auscultation, nonlabored breathing at rest. Cardiac: Regular rate and rhythm, no S3 or significant systolic murmur, no pericardial rub. Abdomen: Soft, nontender, no hepatomegaly, bowel sounds present, no guarding or rebound. Extremities: No pitting edema, distal pulses 2+. Skin: Warm and dry. Musculoskeletal: No kyphosis. Neuropsychiatric: Alert and oriented x3, affect grossly appropriate.  ECG:    Recent Labwork: 02/21/2021: ALT 22; AST 23; BUN 16; Creatinine, Ser 0.58; Hemoglobin 13.8; Platelets 243; Potassium 4.2; Sodium 140  No results found for: CHOL, TRIG, HDL, CHOLHDL, VLDL, LDLCALC, LDLDIRECT  Other Studies Reviewed Today:   Lexiscan stress test 01/18/2021  Narrative & Impression  No diagnostic ST segment changes to indicate  ischemia. Small, mild intensity, partially reversible basal inferolateral defect suggestive of a mild ischemic territory, although noted in the setting of breast attenuation. This is a low risk study. Nuclear stress EF: 78%.       Cardiac monitor 09/04/2020 Study Highlights  7 day event monitor Min HR 51, Max HR 114,Avg HR 68 No symptoms reported No significant arrhythmias      Echocardiogram 08/04/2020 1. Left ventricular ejection fraction, by estimation, is 60 to 65%. The left ventricle has normal function. The left ventricle has no regional wall motion abnormalities. There is mild left ventricular hypertrophy. Left ventricular diastolic parameters are consistent with Grade I diastolic dysfunction (impaired relaxation). 2. Right ventricular systolic function was not well visualized. The right ventricular size is not well visualized. There is normal pulmonary  artery systolic pressure. 3. The mitral valve was not well visualized. No evidence of mitral valve regurgitation. No evidence of mitral stenosis. 4. The aortic valve was not well visualized. Aortic valve regurgitation is trivial. Mild aortic valve stenosis.   Assessment and Plan:  1. Palpitations   2. Murmur   3. Dyspnea on exertion   4. Suspected sleep apnea   5. Essential (primary) hypertension    1. Palpitations At last visit she still had palpitations but were less prevalent since being on metoprolol.  Today she is complaining of more frequent palpitations. Continue Metoprolol 50 mg po bid.   Please get a 14-day ZIO monitor.  2. Heart murmur Echocardiogram on 08/04/2020 demonstrated EF of 60 to 65%.  Mild LVH, G1 DD.  Trivial aortic regurgitation, mild aortic stenosis.   3.  Dyspnea on exertion At prior visit she complained of progressively worsening dyspnea on exertion.  She denied any anginal symptoms such as neck, arm, back, or jaw pain denies any nausea, vomiting, or diaphoresis.  This was assessed to be a  possible anginal equivalent.  She had a subsequent stress test which was low risk study on 01/18/2021.  She saw Dr. Halford Chessman on 04/20/2021 who recommended a PFT study.  PFTs were ordered.  She was to continue albuterol every 4-6 hours as needed for cough, wheezing, congestion or shortness of breath. Phone call on 05/15/2021 with pulmonary and was not able to make it to [facility] office on 02/28/2021 for PFT. Patient wanted PFTs to be done at [facility]. No further records regarding PFTs.  4.  Suspected sleep apnea At prior visit patient stated she had been experiencing significant fatigue and admitted to snoring with excessive daytime sleepiness and fatigue.  She was referred to pulmonary  for possible home sleep study.  She saw Dr. Halford Chessman on 04/20/2021.   She had HST on 04/10/2021 . She had mild obstructive sleep apnea with AHI of 9.1 and Spo2 low of 82% Plan was to arrange for auto CPAP 5 to 15 cm of water.  She was to follow-up in 3 to 4 months.  5.  Hypertension Her blood pressure is elevated on arrival.  States her blood pressures at home are running in the 030S to 923R systolic.  We increased lisinopril from 20 mg to 40.  However she called back later and stated she was on 40 mg of lisinopril per her PCP.  We instead increased the dose of amlodipine from 5mg  to 10 mg p.o. daily.  Basic metabolic panel and magnesium were ordered. Continue Lisinopril 40 mg po daily, Amlodipine 10 mg po daily, Metoprolol 50 mg po bid.  Medication Adjustments/Labs and Tests Ordered: Current medicines are reviewed at length with the patient today.  Concerns regarding medicines are outlined above.   Disposition: Follow-up with Dr Harl Bowie of [facility] 6 months  Signed, Tina July, NP 11/01/2021 9:37 PM    [facility], [place], [place] [id number] Phone: [phone]; Fax: [phone]

## 2021-10-31 NOTE — Patient Instructions (Addendum)
Medication Instructions:  Increase Lisinopril to 20mg  daily  Continue all other medications.     Labwork: BMET, Mg - orders given today. Please do in 2 weeks (around 11/14/2021)  Testing/Procedures: Your physician has recommended that you wear a 14 day event monitor. Event monitors are medical devices that record the heart's electrical activity. Doctors most often [place] these monitors to diagnose arrhythmias. Arrhythmias are problems with the speed or rhythm of the heartbeat. The monitor is a small, portable device. You can wear one while you do your normal daily activities. This is usually used to diagnose what is causing palpitations/syncope (passing out).   Follow-Up: Office will contact with results via phone or letter.    Your physician wants you to follow up in: 6 months.  You will receive a reminder letter in the mail one-two months in advance.  If you don't receive a letter, please call our office to schedule the follow up appointment   Any Other Special Instructions Will Be Listed Below (If Applicable).   If you need a refill on your cardiac medications before your next appointment, please call your pharmacy.

## 2021-10-31 NOTE — Telephone Encounter (Signed)
14 day zio - palps  PERCERT

## 2021-11-01 ENCOUNTER — Encounter: Payer: Self-pay | Admitting: Family Medicine

## 2021-11-01 ENCOUNTER — Telehealth: Payer: Self-pay | Admitting: Family Medicine

## 2021-11-01 MED ORDER — AMLODIPINE BESYLATE 10 MG PO TABS: 10.0000 mg | ORAL_TABLET | Freq: Every day | ORAL | 1 refills | Status: DC

## 2021-11-01 NOTE — Telephone Encounter (Signed)
Patient informed and verbalized understanding of plan. Medication profile updated with correct lisinopril dose

## 2021-11-01 NOTE — Telephone Encounter (Signed)
Patient states her family doctor already has her on 40 mg of lisinopril.  She wants to know if Mitzi Hansen wants her to take the 40mg  plus the additional 20mg  that he increased her too.

## 2021-12-28 ENCOUNTER — Telehealth: Payer: Self-pay

## 2021-12-28 NOTE — Telephone Encounter (Signed)
Called patient to see if she would bring her CPAP card for a DL. Patient states she has not received CPAP and she states she is not sure if she wants a cpap machine. She is going to discuss with Dr. Halford Chessman during [place] tomorrow

## 2021-12-29 ENCOUNTER — Ambulatory Visit: Payer: 59 | Admitting: Pulmonary Disease

## 2022-01-05 ENCOUNTER — Ambulatory Visit: Payer: 59 | Admitting: Pulmonary Disease

## 2022-03-20 ENCOUNTER — Ambulatory Visit: Payer: 59 | Admitting: Pulmonary Disease

## 2022-03-30 ENCOUNTER — Other Ambulatory Visit: Payer: Self-pay

## 2022-03-30 MED ORDER — AMLODIPINE BESYLATE 10 MG PO TABS: 10.0000 mg | ORAL_TABLET | Freq: Every day | ORAL | 1 refills | Status: DC

## 2022-04-17 ENCOUNTER — Ambulatory Visit: Payer: 59 | Admitting: Pulmonary Disease

## 2022-06-08 ENCOUNTER — Ambulatory Visit (HOSPITAL_COMMUNITY)
Admission: RE | Admit: 2022-06-08 | Discharge: 2022-06-08 | Disposition: A | Discharge status: Home / Self Care | Payer: Medicare Other | Source: Ambulatory Visit | Attending: Pulmonary Disease | Admitting: Pulmonary Disease

## 2022-06-08 ENCOUNTER — Encounter: Payer: Self-pay | Admitting: Pulmonary Disease

## 2022-06-08 ENCOUNTER — Ambulatory Visit (INDEPENDENT_AMBULATORY_CARE_PROVIDER_SITE_OTHER): Payer: Medicare Other | Admitting: Pulmonary Disease

## 2022-06-08 VITALS — BP 138/90 | HR 58 | Temp 98.7°F | Ht 65.0 in | Wt 230.2 lb

## 2022-06-08 DIAGNOSIS — R042 Hemoptysis: Secondary | ICD-10-CM

## 2022-06-08 DIAGNOSIS — J209 Acute bronchitis, unspecified: Secondary | ICD-10-CM | POA: Diagnosis present

## 2022-06-08 DIAGNOSIS — G4733 Obstructive sleep apnea (adult) (pediatric): Secondary | ICD-10-CM

## 2022-06-08 MED ORDER — ALBUTEROL SULFATE HFA 108 (90 BASE) MCG/ACT IN AERS: 2.0000 | INHALATION_SPRAY | Freq: Four times a day (QID) | RESPIRATORY_TRACT | 5 refills | Status: AC | PRN

## 2022-06-08 NOTE — Patient Instructions (Signed)
Chest xray today at [facility]  Will arrange for auto CPAP set up  Follow up in 4 months

## 2022-06-08 NOTE — Progress Notes (Signed)
[facility]  Chief Complaint  Patient presents with   Follow-up    Has not received CPAP and is unsure about getting cpap.  Patient is getting over bronchitis.     Constitutional:  BP 138/90 (BP Location: Left Arm, Patient Position: Sitting)   Pulse (!) 58   Temp 98.7 F (37.1 C) (Temporal)   Ht '5\' 5"'$  (1.651 m)   Wt 230 lb 3.2 oz (104.4 kg)   SpO2 97% Comment: ra  BMI 38.31 kg/m   Past Medical History:  OA, Chronic pain, GERD, HLD, HTN, CAD  Past Surgical History:  She  has a past surgical history that includes knee replacements; Shoulder surgery; Carpal tunnel release; Abdominal hysterectomy; Elbow Arthroplasty (Left); Colonoscopy with propofol (N/A, 01/30/2016); and polypectomy (01/30/2016).  Brief Summary:  Tina Morgan is a 71 y.o. female with obstructive sleep apnea.      Subjective:   She never received a CPAP machine.  Her weight is down about 15 lbs.  She was concerned about whether using CPAP could cause her to get a fungal infection, and so didn't get it.  She is still having trouble with her sleep and feeling sleepy during the day.  She wasn't able to get ventral hernia surgery.  She was told she needed to lose weight more before getting surgery.  About 1 week ago she developed fever 101.5 and sinus congestion with cough.  This settled some into her chest.  She is starting to feel better over the last day, and no further fever.  She has been bringing up brown sputum and has some blood streaking in her phlegm.    Physical Exam:   Appearance - well kempt   ENMT - no sinus tenderness, no oral exudate, no LAN, Mallampati 3 airway, no stridor  Respiratory - equal breath sounds bilaterally, no wheezing or rales  CV - s1s2 regular rate and rhythm, no murmurs  Ext - no clubbing, no edema  Skin - no rashes  Psych - normal mood and affect    Pulmonary Tests:    Sleep Tests:  HST 04/10/21 >> AHI 9.1, SpO2  low 82%  Cardiac Tests:  Echo 08/04/20 >> EF 60 to 65%, grade 1 DD, mild AS  Social History:  She  reports that she has never smoked. She has never used smokeless tobacco. She reports that she does not drink alcohol and does not use drugs.  Family History:  Her family history includes Cancer in her mother; Diabetes in her brother and sister; Heart attack in her father; Hypertension in her father; Osteoporosis in her mother.     Assessment/Plan:   Obstructive sleep apnea. - order for CPAP was placed in April 2022, but she deferred getting this due to concerns about infection risks - she is now agreeable to try CPAP - will place order again to arrange for auto CPAP 5 to 15 cm H2O  Acute bronchitis with hemoptysis (mostly blood streaking in sputum). - likely from allergies, [place] [employer] smoke, and virus - clinically improving - don't think she needs antibiotics at present - continue prn mucinex and albuterol - will arrange for chest xray  Coronary artery disease, mild aortic stenosis. - followed by Dr. Carlyle Dolly with [facility]  Ventral hernia. - she is now followed by surgery at [facility] - she was advised she needed additional weight loss before being a surgical candidate  Obesity. - discussed how weight can impact sleep and  risk for sleep disordered breathing - discussed options to assist with weight loss: combination of diet modification, cardiovascular and strength training exercises  Time Spent Involved in Patient Care on Day of Examination:  38 minutes  Follow up:   Patient Instructions  Chest xray today at [facility]  Will arrange for auto CPAP set up  Follow up in 4 months  Medication List:   Allergies as of 06/08/2022       Reactions   Codeine Itching   Morphine And Related Itching   Naproxen    Other reaction(s): rash   Nsaids    itching        Medication List        Accurate as of June 08, 2022 12:13 PM. If you have any  questions, ask your nurse or doctor.          acetaminophen 500 MG tablet Commonly known as: TYLENOL Take 500 mg by mouth daily.   albuterol 108 (90 Base) MCG/ACT inhaler Commonly known as: VENTOLIN HFA Inhale 2 puffs into the lungs every 6 (six) hours as needed for wheezing or shortness of breath.   amLODipine 10 MG tablet Commonly known as: NORVASC Take 1 tablet (10 mg total) by mouth daily.   cetirizine 10 MG tablet Commonly known as: ZYRTEC Take 10 mg by mouth daily.   dicyclomine 10 MG capsule Commonly known as: BENTYL Take 10 mg by mouth 4 (four) times daily -  before meals and at bedtime. Reported on 01/24/2016   DULoxetine 60 MG capsule Commonly known as: CYMBALTA Take 60 mg by mouth daily. Takes 2 capsules daily   esomeprazole 40 MG capsule Commonly known as: NEXIUM Take 40 mg by mouth daily at 12 noon.   fluticasone 50 MCG/ACT nasal spray Commonly known as: FLONASE as needed.   isosorbide dinitrate 10 MG tablet Commonly known as: ISORDIL Take 10 mg by mouth 3 (three) times daily.   linaclotide 290 MCG Caps capsule Commonly known as: LINZESS Take 1 capsule (290 mcg total) by mouth daily.   lisinopril 40 MG tablet Commonly known as: ZESTRIL Take 40 mg by mouth every morning.   meloxicam 15 MG tablet Commonly known as: MOBIC Take 15 mg by mouth daily.   metoprolol tartrate 50 MG tablet Commonly known as: LOPRESSOR Take 50 mg by mouth 2 (two) times daily.   mirabegron ER 50 MG Tb24 tablet Commonly known as: MYRBETRIQ Take 50 mg by mouth daily.   rosuvastatin 5 MG tablet Commonly known as: CRESTOR Take 5 mg by mouth daily.   TYLENOL PM EXTRA STRENGTH PO Take by mouth. Takes 2 capsules two to three times per week   Vitamin D3 125 MCG (5000 UT) Caps Take 1 capsule (5,000 Units total) by mouth daily.   zolpidem 10 MG tablet Commonly known as: AMBIEN Take 10 mg by mouth at bedtime as needed for sleep.        Signature:  Chesley Mires,  MD [place] Pager - [phone] 06/08/2022, 12:13 PM

## 2022-07-05 ENCOUNTER — Encounter: Payer: Self-pay | Admitting: *Deleted

## 2022-07-05 ENCOUNTER — Encounter: Payer: Self-pay | Admitting: Cardiology

## 2022-07-05 ENCOUNTER — Ambulatory Visit (INDEPENDENT_AMBULATORY_CARE_PROVIDER_SITE_OTHER): Payer: Medicare HMO | Admitting: Cardiology

## 2022-07-05 VITALS — BP 138/70 | HR 62 | Ht 66.0 in | Wt 228.2 lb

## 2022-07-05 DIAGNOSIS — I10 Essential (primary) hypertension: Secondary | ICD-10-CM | POA: Diagnosis not present

## 2022-07-05 DIAGNOSIS — R0609 Other forms of dyspnea: Secondary | ICD-10-CM | POA: Diagnosis not present

## 2022-07-05 DIAGNOSIS — E782 Mixed hyperlipidemia: Secondary | ICD-10-CM

## 2022-07-05 DIAGNOSIS — R002 Palpitations: Secondary | ICD-10-CM

## 2022-07-05 MED ORDER — CHLORTHALIDONE 25 MG PO TABS: 12.5000 mg | ORAL_TABLET | Freq: Every day | ORAL | 1 refills | Status: DC

## 2022-07-05 NOTE — Progress Notes (Signed)
Clinical Summary Tina Morgan is a 72 y.o.female seen today for follow up of the following medical problems.   Previously followed at [facility] cardiology  1. Palpitations - previous monitor at 2017 [facility] showed symptoms correlated with SR and PACs  06/2020 event monitor: no significant arrhythmias  10/2021 monitor: SR, rare ectopy. Avg HR 63, some junction rhythm present.    - infrequent symptoms -compliant with meds     2. History of demand ischemia - from [facility] noted history of 2016 admission after being found unresponsive, unclear how long she was down. Ultimately treated for sepsis, respiratory failure requiring intubation.  - elevated trop at that time. Subsequent cath without significant disease - no chest pains, has been isordil she thinks around since 2016.    3. Aortic stenosis - 07/2020 echo: LVEF 60-65%, no WMAs, mean grad 14.5 mmHg, AVA VTI 2.6 - no symptoms   4. DOE DOE improved, does have some allergies which can cause. Has lost 11 lbs since last year.     5. Preoperative evaluation - considering hernia surgery -surgery on hold for ongoing weight loss.   6.OSA - followed by Dr Halford Chessman - working to get cpap  7. HTN - compliant with meds - home bp's 140s/80s, above goal at recent pulm visit  8. Hyperlipidemia - on crestor.  - labs followed by pcp Past Medical History:  Diagnosis Date   Arthritis    osteoarthritis   Chronic pain    GERD (gastroesophageal reflux disease)    Hyperlipidemia    Hypertension    Lumbar disc disease    herniated disc   MI (myocardial infarction) ([facility])    [facility], No stents placed November 2016     Allergies  Allergen Reactions   Codeine Itching   Morphine And Related Itching   Naproxen     Other reaction(s): rash   Nsaids     itching     Current Outpatient Medications  Medication Sig Dispense Refill   acetaminophen (TYLENOL) 500 MG tablet Take 500 mg by mouth daily.     albuterol (VENTOLIN HFA) 108 (90 Base) MCG/ACT  inhaler Inhale 2 puffs into the lungs every 6 (six) hours as needed for wheezing or shortness of breath. 8 g 5   amLODipine (NORVASC) 10 MG tablet Take 1 tablet (10 mg total) by mouth daily. 90 tablet 1   cetirizine (ZYRTEC) 10 MG tablet Take 10 mg by mouth daily.     Cholecalciferol (VITAMIN D3) 5000 units CAPS Take 1 capsule (5,000 Units total) by mouth daily. 90 capsule 0   dicyclomine (BENTYL) 10 MG capsule Take 10 mg by mouth 4 (four) times daily -  before meals and at bedtime. Reported on 01/24/2016     diphenhydrAMINE-APAP, sleep, (TYLENOL PM EXTRA STRENGTH PO) Take by mouth. Takes 2 capsules two to three times per week     DULoxetine (CYMBALTA) 60 MG capsule Take 60 mg by mouth daily. Takes 2 capsules daily     esomeprazole (NEXIUM) 40 MG capsule Take 40 mg by mouth daily at 12 noon.     fluticasone (FLONASE) 50 MCG/ACT nasal spray as needed.      isosorbide dinitrate (ISORDIL) 10 MG tablet Take 10 mg by mouth 3 (three) times daily.     Linaclotide (LINZESS) 290 MCG CAPS capsule Take 1 capsule (290 mcg total) by mouth daily. 30 capsule 6   lisinopril (ZESTRIL) 40 MG tablet Take 40 mg by mouth every morning.  meloxicam (MOBIC) 15 MG tablet Take 15 mg by mouth daily.     metoprolol (LOPRESSOR) 50 MG tablet Take 50 mg by mouth 2 (two) times daily.     mirabegron ER (MYRBETRIQ) 50 MG TB24 tablet Take 50 mg by mouth daily.      rosuvastatin (CRESTOR) 5 MG tablet Take 5 mg by mouth daily.     zolpidem (AMBIEN) 10 MG tablet Take 10 mg by mouth at bedtime as needed for sleep.     No current facility-administered medications for this visit.     Past Surgical History:  Procedure Laterality Date   ABDOMINAL HYSTERECTOMY     CARPAL TUNNEL RELEASE     COLONOSCOPY WITH PROPOFOL N/A 01/30/2016   Procedure: COLONOSCOPY WITH PROPOFOL;  Surgeon: Lucilla Lame, MD;  Location: [place];  Service: Endoscopy;  Laterality: N/A;   ELBOW ARTHROPLASTY Left    knee replacements     POLYPECTOMY   01/30/2016   Procedure: POLYPECTOMY;  Surgeon: Lucilla Lame, MD;  Location: [place];  Service: Endoscopy;;   SHOULDER SURGERY       Allergies  Allergen Reactions   Codeine Itching   Morphine And Related Itching   Naproxen     Other reaction(s): rash   Nsaids     itching      Family History  Problem Relation Age of Onset   Cancer Mother    Osteoporosis Mother    Hypertension Father    Heart attack Father    Diabetes Sister    Diabetes Brother      Social History Tina Morgan reports that she has never smoked. She has never used smokeless tobacco. Tina Morgan reports no history of alcohol use.   Review of Systems CONSTITUTIONAL: No weight loss, fever, chills, weakness or fatigue.  HEENT: Eyes: No visual loss, blurred vision, double vision or yellow sclerae.No hearing loss, sneezing, congestion, runny nose or sore throat.  SKIN: No rash or itching.  CARDIOVASCULAR: per hpi RESPIRATORY: No shortness of breath, cough or sputum.  GASTROINTESTINAL: No anorexia, nausea, vomiting or diarrhea. No abdominal pain or blood.  GENITOURINARY: No burning on urination, no polyuria NEUROLOGICAL: No headache, dizziness, syncope, paralysis, ataxia, numbness or tingling in the extremities. No change in bowel or bladder control.  MUSCULOSKELETAL: No muscle, back pain, joint pain or stiffness.  LYMPHATICS: No enlarged nodes. No history of splenectomy.  PSYCHIATRIC: No history of depression or anxiety.  ENDOCRINOLOGIC: No reports of sweating, cold or heat intolerance. No polyuria or polydipsia.  Marland Kitchen   Physical Examination Today's Vitals   07/05/22 1051  BP: 140/70  Pulse: 62  SpO2: 96%  Weight: 228 lb 3.2 oz (103.5 kg)  Height: '5\' 6"'$  (1.676 m)   Body mass index is 36.83 kg/m.  Gen: resting comfortably, no acute distress HEENT: no scleral icterus, pupils equal round and reactive, no palptable cervical adenopathy,  CV: RRR, no m/r/g no jvd Resp: Clear to auscultation  bilaterally GI: abdomen is soft, non-tender, non-distended, normal bowel sounds, no hepatosplenomegaly MSK: extremities are warm, no edema.  Skin: warm, no rash Neuro:  no focal deficits Psych: appropriate affect   Diagnostic Studies  06/2020 event monitor 7 day event monitor Min HR 51, Max HR 114,Avg HR 68 No symptoms reported No significant arrhythmias     07/2020 echo 1. Left ventricular ejection fraction, by estimation, is 60 to 65%. The  left ventricle has normal function. The left ventricle has no regional  wall motion abnormalities. There is mild  left ventricular hypertrophy.  Left ventricular diastolic parameters  are consistent with Grade I diastolic dysfunction (impaired relaxation).   2. Right ventricular systolic function was not well visualized. The right  ventricular size is not well visualized. There is normal pulmonary artery  systolic pressure.   3. The mitral valve was not well visualized. No evidence of mitral valve  regurgitation. No evidence of mitral stenosis.   4. The aortic valve was not well visualized. Aortic valve regurgitation  is trivial. Mild aortic valve stenosis.   Jan 2022 nuclear stress No diagnostic ST segment changes to indicate ischemia. Small, mild intensity, partially reversible basal inferolateral defect suggestive of a mild ischemic territory, although noted in the setting of breast attenuation. This is a low risk study. Nuclear stress EF: 78%.        Assessment and Plan  1. Palpitations - mild infrequent symptoms, prior monitor with just benign ectopy. Low normal HR, some junction rhythms on last monitor, would not titrate beta blocker any further in the future.  - EKG today shows NSR with PACs   2. DOE - benign cardiac workup, symptoms have improved with weight loss - continue to monitor  3. HTN - above goal, start chlortahlidone 12.'5mg'$  daily. BMET/mg in 2 weeks.    4.Hyperliidamia - repeat lipid panel.     F/u 6  months   Arnoldo Lenis, M.D.

## 2022-07-05 NOTE — Patient Instructions (Signed)
Medication Instructions:  Your physician has recommended you make the following change in your medication:  Start chlorthalidone 12.5 mg once a day Continue all other medications as directed  Labwork: 2 weeks at [facility]  Testing/Procedures: None  Follow-Up:  Your physician recommends that you schedule a follow-up appointment in: 6 months  Any Other Special Instructions Will Be Listed Below (If Applicable).  If you need a refill on your cardiac medications before your next appointment, please call your pharmacy.

## 2022-08-13 ENCOUNTER — Telehealth: Payer: Self-pay | Admitting: Cardiology

## 2022-08-13 NOTE — Telephone Encounter (Signed)
Reports starting chlorthalidone 12.5 mg daily since 07/05/2022. Reports bilateral lower back pain since Friday (08/10/22), rated 7/10 Took tylenol and says he didn't help.  Said she did not take chlorthalidone today Advised to hold chlorthalidone for the next week to see if symptoms improve Advised that her pain is less likely being caused by chlorthalidone Advised to contact her PCP or her back doctor for management and evaluation of her back pain Verbalized understanding of plan.

## 2022-08-13 NOTE — Telephone Encounter (Signed)
Pt c/o medication issue:  1. Name of Medication: chlorthalidone (HYGROTON) 25 MG tablet  2. How are you currently taking this medication (dosage and times per day)? As prescribed  3. Are you having a reaction (difficulty breathing--STAT)? Yes  4. What is your medication issue? Medications is causing both of her kidneys to hurt. States she has been in pain since Friday 08/18 and has never had an issue with her kidneys before. Wants to know if she should stop medication. Please advise.

## 2022-08-13 NOTE — Telephone Encounter (Signed)
Agree with your assessment and plan  Zandra Abts MD

## 2022-10-21 ENCOUNTER — Other Ambulatory Visit: Payer: Self-pay | Admitting: Cardiology

## 2022-12-05 ENCOUNTER — Other Ambulatory Visit: Payer: Self-pay | Admitting: *Deleted

## 2022-12-05 MED ORDER — CHLORTHALIDONE 25 MG PO TABS
12.5000 mg | ORAL_TABLET | Freq: Every day | ORAL | 1 refills | Status: DC
Start: 2022-12-05 — End: 2022-12-10

## 2022-12-05 MED ORDER — AMLODIPINE BESYLATE 10 MG PO TABS: 10.0000 mg | ORAL_TABLET | Freq: Every day | ORAL | 1 refills | Status: DC

## 2022-12-07 ENCOUNTER — Telehealth: Payer: Self-pay | Admitting: Cardiology

## 2022-12-07 NOTE — Telephone Encounter (Signed)
 *  STAT* If patient is at the pharmacy, call can be transferred to refill team.   1. Which medications need to be refilled? (please list name of each medication and dose if known)   amLODipine (NORVASC) 10 MG tablet    chlorthalidone (HYGROTON) 25 MG tablet    2. Which pharmacy/location (including street and city if local pharmacy) is medication to be sent to? [facility] [place])  [place] fax# [phone]  3. Do they need a 30 day or 90 day supply? 90 days  Per [facility] resend refill to [place] location

## 2022-12-07 NOTE — Telephone Encounter (Signed)
Both rx's sent 12/05/2022 and re-faxed today to fax number provided below [phone])

## 2022-12-10 ENCOUNTER — Other Ambulatory Visit: Payer: Self-pay | Admitting: *Deleted

## 2022-12-10 MED ORDER — AMLODIPINE BESYLATE 10 MG PO TABS: 10.0000 mg | ORAL_TABLET | Freq: Every day | ORAL | 1 refills | Status: DC

## 2022-12-10 MED ORDER — CHLORTHALIDONE 25 MG PO TABS: 12.5000 mg | ORAL_TABLET | Freq: Every day | ORAL | 1 refills | Status: DC

## 2023-01-11 ENCOUNTER — Encounter: Payer: Self-pay | Admitting: *Deleted

## 2023-01-21 ENCOUNTER — Encounter: Payer: Self-pay | Admitting: *Deleted

## 2023-01-21 ENCOUNTER — Ambulatory Visit: Payer: Medicare HMO | Attending: Cardiology | Admitting: Cardiology

## 2023-01-21 ENCOUNTER — Encounter: Payer: Self-pay | Admitting: Cardiology

## 2023-01-21 VITALS — BP 136/76 | HR 70 | Ht 65.0 in | Wt 226.8 lb

## 2023-01-21 DIAGNOSIS — I10 Essential (primary) hypertension: Secondary | ICD-10-CM

## 2023-01-21 DIAGNOSIS — R002 Palpitations: Secondary | ICD-10-CM | POA: Diagnosis not present

## 2023-01-21 DIAGNOSIS — I350 Nonrheumatic aortic (valve) stenosis: Secondary | ICD-10-CM | POA: Diagnosis not present

## 2023-01-21 DIAGNOSIS — E782 Mixed hyperlipidemia: Secondary | ICD-10-CM | POA: Diagnosis not present

## 2023-01-21 NOTE — Patient Instructions (Signed)
Medication Instructions:  Continue all current medications.    Labwork: none  Testing/Procedures: Your physician has requested that you have an echocardiogram. Echocardiography is a painless test that uses sound waves to create images of your heart. It provides your doctor with information about the size and shape of your heart and how well your heart's chambers and valves are working. This procedure takes approximately one hour. There are no restrictions for this procedure. Please do NOT wear cologne, perfume, aftershave, or lotions (deodorant is allowed). Please arrive 15 minutes prior to your appointment time. Office will contact with results via phone, letter or mychart.     Follow-Up: 6 months   Any Other Special Instructions Will Be Listed Below (If Applicable).   If you need a refill on your cardiac medications before your next appointment, please call your pharmacy.

## 2023-01-21 NOTE — Progress Notes (Signed)
Clinical Summary Ms. Curran is a 72 y.o.female seen today for follow up of the following medical problems.    Previously followed at [facility] cardiology   1. Palpitations - previous monitor at 2017 [facility] showed symptoms correlated with SR and PACs  06/2020 event monitor: no significant arrhythmias  10/2021 monitor: SR, rare ectopy. Avg HR 63, some junction rhythm present.      - recent palpitations, chest pains. started about 1 week ago - sharp/stabbing pain left sided, 5/10 in severity. Some SOB. Lasts a few seconds, but can be repetive. Typically occur at rest with laying down. +palpitations associated. - will occur 3 nights in a row, then resolve - recent stressful family issues, daughter moving back home.  - compliant with metoprolol     2. History of demand ischemia - from [facility] noted history of 2016 admission after being found unresponsive, unclear how long she was down. Ultimately treated for sepsis, respiratory failure requiring intubation.  - elevated trop at that time. Subsequent cath without significant disease - no chest pains, has been isordil she thinks around since 2016.    3. Aortic stenosis - 07/2020 echo: LVEF 60-65%, no WMAs, mean grad 14.5 mmHg, AVA VTI 2.6 - no symptoms   4. DOE DOE improved, does have some allergies which can cause. Has lost 11 lbs since last year.      5. Preoperative evaluation - considering hernia surgery -surgery on hold for ongoing weight loss.    6.OSA - followed by Dr Halford Chessman - working to get cpap   7. HTN - compliant with meds - home bp's 140s/80s, above goal at recent pulm visit   8. Hyperlipidemia - labs followed by pcp just a few weeks ago     Past Medical History:  Diagnosis Date   Arthritis    osteoarthritis   Chronic pain    GERD (gastroesophageal reflux disease)    Hyperlipidemia    Hypertension    Lumbar disc disease    herniated disc   MI (myocardial infarction) ([place])    [facility], No stents placed  November 2016     Allergies  Allergen Reactions   Codeine Itching   Morphine And Related Itching   Naproxen     Other reaction(s): rash   Nsaids     itching     Current Outpatient Medications  Medication Sig Dispense Refill   acetaminophen (TYLENOL) 500 MG tablet Take 500 mg by mouth daily.     albuterol (VENTOLIN HFA) 108 (90 Base) MCG/ACT inhaler Inhale 2 puffs into the lungs every 6 (six) hours as needed for wheezing or shortness of breath. 8 g 5   amLODipine (NORVASC) 10 MG tablet Take 1 tablet (10 mg total) by mouth daily. 90 tablet 1   cetirizine (ZYRTEC) 10 MG tablet Take 10 mg by mouth daily.     chlorthalidone (HYGROTON) 25 MG tablet Take 0.5 tablets (12.5 mg total) by mouth daily. 45 tablet 1   Cholecalciferol (VITAMIN D3) 5000 units CAPS Take 1 capsule (5,000 Units total) by mouth daily. 90 capsule 0   dicyclomine (BENTYL) 10 MG capsule Take 10 mg by mouth 4 (four) times daily -  before meals and at bedtime. Reported on 01/24/2016     diphenhydrAMINE-APAP, sleep, (TYLENOL PM EXTRA STRENGTH PO) Take by mouth. Takes 2 capsules two to three times per week     DULoxetine (CYMBALTA) 60 MG capsule Take 60 mg by mouth daily. Takes 2 capsules daily  esomeprazole (NEXIUM) 40 MG capsule Take 40 mg by mouth daily at 12 noon.     fluticasone (FLONASE) 50 MCG/ACT nasal spray as needed.      isosorbide dinitrate (ISORDIL) 10 MG tablet Take 10 mg by mouth 3 (three) times daily.     Linaclotide (LINZESS) 290 MCG CAPS capsule Take 1 capsule (290 mcg total) by mouth daily. 30 capsule 6   lisinopril (ZESTRIL) 40 MG tablet Take 40 mg by mouth every morning.     meloxicam (MOBIC) 15 MG tablet Take 15 mg by mouth daily.     metoprolol (LOPRESSOR) 50 MG tablet Take 50 mg by mouth 2 (two) times daily.     mirabegron ER (MYRBETRIQ) 50 MG TB24 tablet Take 50 mg by mouth daily.      rosuvastatin (CRESTOR) 5 MG tablet Take 5 mg by mouth daily.     zolpidem (AMBIEN) 10 MG tablet Take 10 mg by  mouth at bedtime as needed for sleep.     No current facility-administered medications for this visit.     Past Surgical History:  Procedure Laterality Date   ABDOMINAL HYSTERECTOMY     CARPAL TUNNEL RELEASE     COLONOSCOPY WITH PROPOFOL N/A 01/30/2016   Procedure: COLONOSCOPY WITH PROPOFOL;  Surgeon: Lucilla Lame, MD;  Location: [place];  Service: Endoscopy;  Laterality: N/A;   ELBOW ARTHROPLASTY Left    knee replacements     POLYPECTOMY  01/30/2016   Procedure: POLYPECTOMY;  Surgeon: Lucilla Lame, MD;  Location: [place];  Service: Endoscopy;;   SHOULDER SURGERY       Allergies  Allergen Reactions   Codeine Itching   Morphine And Related Itching   Naproxen     Other reaction(s): rash   Nsaids     itching      Family History  Problem Relation Age of Onset   Cancer Mother    Osteoporosis Mother    Hypertension Father    Heart attack Father    Diabetes Sister    Diabetes Brother      Social History Ms. Thiemann reports that she has never smoked. She has never used smokeless tobacco. Ms. Colquhoun reports no history of alcohol use.   Review of Systems CONSTITUTIONAL: No weight loss, fever, chills, weakness or fatigue.  HEENT: Eyes: No visual loss, blurred vision, double vision or yellow sclerae.No hearing loss, sneezing, congestion, runny nose or sore throat.  SKIN: No rash or itching.  CARDIOVASCULAR: per hpi RESPIRATORY: No shortness of breath, cough or sputum.  GASTROINTESTINAL: No anorexia, nausea, vomiting or diarrhea. No abdominal pain or blood.  GENITOURINARY: No burning on urination, no polyuria NEUROLOGICAL: No headache, dizziness, syncope, paralysis, ataxia, numbness or tingling in the extremities. No change in bowel or bladder control.  MUSCULOSKELETAL: No muscle, back pain, joint pain or stiffness.  LYMPHATICS: No enlarged nodes. No history of splenectomy.  PSYCHIATRIC: No history of depression or anxiety.  ENDOCRINOLOGIC: No  reports of sweating, cold or heat intolerance. No polyuria or polydipsia.  Marland Kitchen   Physical Examination Today's Vitals   01/21/23 1034  BP: 136/76  Pulse: 70  SpO2: 96%  Weight: 226 lb 12.8 oz (102.9 kg)  Height: '5\' 5"'$  (1.651 m)   Body mass index is 37.74 kg/m.  Gen: resting comfortably, no acute distress HEENT: no scleral icterus, pupils equal round and reactive, no palptable cervical adenopathy,  CV: RRR,, 2/6 systoilc murmur rusb Resp: Clear to auscultation bilaterally GI: abdomen is soft, non-tender, non-distended, normal  bowel sounds, no hepatosplenomegaly MSK: extremities are warm, no edema.  Skin: warm, no rash Neuro:  no focal deficits Psych: appropriate affect   Diagnostic Studies 06/2020 event monitor 7 day event monitor Min HR 51, Max HR 114,Avg HR 68 No symptoms reported No significant arrhythmias     07/2020 echo 1. Left ventricular ejection fraction, by estimation, is 60 to 65%. The  left ventricle has normal function. The left ventricle has no regional  wall motion abnormalities. There is mild left ventricular hypertrophy.  Left ventricular diastolic parameters  are consistent with Grade I diastolic dysfunction (impaired relaxation).   2. Right ventricular systolic function was not well visualized. The right  ventricular size is not well visualized. There is normal pulmonary artery  systolic pressure.   3. The mitral valve was not well visualized. No evidence of mitral valve  regurgitation. No evidence of mitral stenosis.   4. The aortic valve was not well visualized. Aortic valve regurgitation  is trivial. Mild aortic valve stenosis.    Jan 2022 nuclear stress No diagnostic ST segment changes to indicate ischemia. Small, mild intensity, partially reversible basal inferolateral defect suggestive of a mild ischemic territory, although noted in the setting of breast attenuation. This is a low risk study. Nuclear stress EF: 78%.        Assessment and  Plan  1. Palpitations - recent increased palpitaiotns and related chest pains, likely stress related due to some recent family issues - discussed increasing lopressor, she is in favor on remaining on current dose. Discussed could take additional '25mg'$  prn as needed      2. HTN - at goal,continue current meds   3..Hyperliidamia - request pcp labs, continue current meds  4. Aortic stenosis - repeat echo, 2017 mild AS    Arnoldo Lenis, M.D.

## 2023-01-24 ENCOUNTER — Other Ambulatory Visit: Payer: Medicare HMO

## 2023-02-09 ENCOUNTER — Other Ambulatory Visit: Payer: Self-pay | Admitting: Cardiology

## 2023-05-17 ENCOUNTER — Other Ambulatory Visit: Payer: Self-pay | Admitting: Cardiology

## 2023-06-19 ENCOUNTER — Other Ambulatory Visit: Payer: Medicare HMO

## 2023-06-25 ENCOUNTER — Encounter: Payer: Self-pay | Admitting: *Deleted

## 2023-07-24 ENCOUNTER — Ambulatory Visit: Payer: Medicare HMO

## 2023-07-24 DIAGNOSIS — I350 Nonrheumatic aortic (valve) stenosis: Secondary | ICD-10-CM | POA: Diagnosis not present

## 2023-07-31 ENCOUNTER — Ambulatory Visit: Payer: Medicare HMO | Attending: Cardiology | Admitting: Cardiology

## 2023-07-31 ENCOUNTER — Encounter: Payer: Self-pay | Admitting: Cardiology

## 2023-07-31 VITALS — BP 138/98 | HR 62 | Ht 64.0 in | Wt 220.0 lb

## 2023-07-31 DIAGNOSIS — I350 Nonrheumatic aortic (valve) stenosis: Secondary | ICD-10-CM | POA: Diagnosis not present

## 2023-07-31 DIAGNOSIS — E782 Mixed hyperlipidemia: Secondary | ICD-10-CM

## 2023-07-31 DIAGNOSIS — R002 Palpitations: Secondary | ICD-10-CM | POA: Diagnosis not present

## 2023-07-31 DIAGNOSIS — I10 Essential (primary) hypertension: Secondary | ICD-10-CM | POA: Diagnosis not present

## 2023-07-31 MED ORDER — ISOSORBIDE DINITRATE 10 MG PO TABS: 10.0000 mg | ORAL_TABLET | Freq: Two times a day (BID) | ORAL | 6 refills | Status: DC

## 2023-07-31 MED ORDER — METOPROLOL TARTRATE 75 MG PO TABS: 75.0000 mg | ORAL_TABLET | Freq: Two times a day (BID) | ORAL | 6 refills | Status: DC

## 2023-07-31 MED ORDER — ISOSORBIDE DINITRATE 10 MG PO TABS: 10.0000 mg | ORAL_TABLET | Freq: Two times a day (BID) | ORAL | Status: DC

## 2023-07-31 NOTE — Progress Notes (Signed)
Clinical Summary Tina Morgan is a 72 y.o.female seen today for follow up of the following medical problems.    Previously followed at [facility] cardiology   1. Palpitations - previous monitor at 2017 [facility] showed symptoms correlated with SR and PACs  06/2020 event monitor: no significant arrhythmias  10/2021 monitor: SR, rare ectopy. Avg HR 63, some junction rhythm present.     - over last month daily symptoms, lasts few minutes at a time. Can awake her from night - compliant with meds.        2. History of demand ischemia - from [facility] noted history of 2016 admission after being found unresponsive, unclear how long she was down. Ultimately treated for sepsis, respiratory failure requiring intubation.  - elevated trop at that time. Subsequent cath without significant disease - no chest pains, has been isordil she thinks around since 2016.    3. Aortic stenosis - 07/2020 echo: LVEF 60-65%, no WMAs, mean grad 14.5 mmHg, AVA VTI 2.6 - 06/2023 echo: LVEF 60-65%, no WMAs, grade I dd, mild AS mean grad 14 AVA VTI 2.58     4.OSA - followed by Dr Craige Cotta - working to get cpap   5. HTN - she is compliant with meds - chronic orthostatic symptoms may be med related, reports adequate hydration.    6. Hyperlipidemia - labs followed by pcp just a few weeks ago Jan 2024 TC 156 TG 208 LDL 70 HDL 56     Past Medical History:  Diagnosis Date   Arthritis    osteoarthritis   Chronic pain    GERD (gastroesophageal reflux disease)    Hyperlipidemia    Hypertension    Lumbar disc disease    herniated disc   MI (myocardial infarction) (HCC)    [facility], No stents placed November 2016     Allergies  Allergen Reactions   Codeine Itching   Morphine And Codeine Itching   Naproxen     Other reaction(s): rash   Nsaids     itching     Current Outpatient Medications  Medication Sig Dispense Refill   acetaminophen (TYLENOL) 500 MG tablet Take 500 mg by mouth daily.     albuterol  (VENTOLIN HFA) 108 (90 Base) MCG/ACT inhaler Inhale 2 puffs into the lungs every 6 (six) hours as needed for wheezing or shortness of breath. 8 g 5   amLODipine (NORVASC) 10 MG tablet TAKE ONE TABLET BY MOUTH EVERY DAY 90 tablet 3   cetirizine (ZYRTEC) 10 MG tablet Take 10 mg by mouth daily.     chlorthalidone (HYGROTON) 25 MG tablet TAKE 1/2 TABLET BY MOUTH EVERY DAY 45 tablet 1   Cholecalciferol (VITAMIN D3) 5000 units CAPS Take 1 capsule (5,000 Units total) by mouth daily. 90 capsule 0   dicyclomine (BENTYL) 10 MG capsule Take 10 mg by mouth 4 (four) times daily -  before meals and at bedtime. Reported on 01/24/2016     diphenhydrAMINE-APAP, sleep, (TYLENOL PM EXTRA STRENGTH PO) Take by mouth. Takes 2 capsules two to three times per week     DULoxetine (CYMBALTA) 60 MG capsule Take 60 mg by mouth daily. Takes 2 capsules daily     esomeprazole (NEXIUM) 40 MG capsule Take 40 mg by mouth daily at 12 noon.     fluticasone (FLONASE) 50 MCG/ACT nasal spray as needed.      isosorbide dinitrate (ISORDIL) 10 MG tablet Take 10 mg by mouth 3 (three) times daily.  Linaclotide (LINZESS) 290 MCG CAPS capsule Take 1 capsule (290 mcg total) by mouth daily. 30 capsule 6   lisinopril (ZESTRIL) 40 MG tablet Take 40 mg by mouth every morning.     meloxicam (MOBIC) 15 MG tablet Take 15 mg by mouth daily.     metoprolol (LOPRESSOR) 50 MG tablet Take 50 mg by mouth 2 (two) times daily.     mirabegron ER (MYRBETRIQ) 50 MG TB24 tablet Take 50 mg by mouth daily.      rosuvastatin (CRESTOR) 5 MG tablet Take 5 mg by mouth daily.     zolpidem (AMBIEN) 10 MG tablet Take 10 mg by mouth at bedtime as needed for sleep.     No current facility-administered medications for this visit.     Past Surgical History:  Procedure Laterality Date   ABDOMINAL HYSTERECTOMY     CARPAL TUNNEL RELEASE     COLONOSCOPY WITH PROPOFOL N/A 01/30/2016   Procedure: COLONOSCOPY WITH PROPOFOL;  Surgeon: Midge Minium, MD;  Location: [facility];  Service: Endoscopy;  Laterality: N/A;   ELBOW ARTHROPLASTY Left    knee replacements     POLYPECTOMY  01/30/2016   Procedure: POLYPECTOMY;  Surgeon: Midge Minium, MD;  Location: [facility];  Service: Endoscopy;;   SHOULDER SURGERY       Allergies  Allergen Reactions   Codeine Itching   Morphine And Codeine Itching   Naproxen     Other reaction(s): rash   Nsaids     itching      Family History  Problem Relation Age of Onset   Cancer Mother    Osteoporosis Mother    Hypertension Father    Heart attack Father    Diabetes Sister    Diabetes Brother      Social History Tina Morgan reports that she has never smoked. She has never used smokeless tobacco. Tina Morgan reports no history of alcohol use.   Review of Systems CONSTITUTIONAL: No weight loss, fever, chills, weakness or fatigue.  HEENT: Eyes: No visual loss, blurred vision, double vision or yellow sclerae.No hearing loss, sneezing, congestion, runny nose or sore throat.  SKIN: No rash or itching.  CARDIOVASCULAR: per hpi RESPIRATORY: No shortness of breath, cough or sputum.  GASTROINTESTINAL: No anorexia, nausea, vomiting or diarrhea. No abdominal pain or blood.  GENITOURINARY: No burning on urination, no polyuria NEUROLOGICAL: dizziness with standing MUSCULOSKELETAL: No muscle, back pain, joint pain or stiffness.  LYMPHATICS: No enlarged nodes. No history of splenectomy.  PSYCHIATRIC: No history of depression or anxiety.  ENDOCRINOLOGIC: No reports of sweating, cold or heat intolerance. No polyuria or polydipsia.  Marland Kitchen   Physical Examination Today's Vitals   07/31/23 1105  BP: (!) 138/98  Pulse: 62  SpO2: 98%  Weight: 220 lb (99.8 kg)  Height: 5\' 4"  (1.626 m)   Body mass index is 37.76 kg/m.  Gen: resting comfortably, no acute distress HEENT: no scleral icterus, pupils equal round and reactive, no palptable cervical adenopathy,  CV: RRR, 2/6 systolic murmur rusb, no  jvd Resp: Clear to auscultation bilaterally GI: abdomen is soft, non-tender, non-distended, normal bowel sounds, no hepatosplenomegaly MSK: extremities are warm, no edema.  Skin: warm, no rash Neuro:  no focal deficits Psych: appropriate affect   Diagnostic Studies  06/2020 event monitor 7 day event monitor Min HR 51, Max HR 114,Avg HR 68 No symptoms reported No significant arrhythmias     07/2020 echo 1. Left ventricular ejection fraction, by estimation, is 60 to 65%.  The  left ventricle has normal function. The left ventricle has no regional  wall motion abnormalities. There is mild left ventricular hypertrophy.  Left ventricular diastolic parameters  are consistent with Grade I diastolic dysfunction (impaired relaxation).   2. Right ventricular systolic function was not well visualized. The right  ventricular size is not well visualized. There is normal pulmonary artery  systolic pressure.   3. The mitral valve was not well visualized. No evidence of mitral valve  regurgitation. No evidence of mitral stenosis.   4. The aortic valve was not well visualized. Aortic valve regurgitation  is trivial. Mild aortic valve stenosis.    Jan 2022 nuclear stress No diagnostic ST segment changes to indicate ischemia. Small, mild intensity, partially reversible basal inferolateral defect suggestive of a mild ischemic territory, although noted in the setting of breast attenuation. This is a low risk study. Nuclear stress EF: 78%.      06/2023 echo 1. Left ventricular ejection fraction, by estimation, is 60 to 65%. The  left ventricle has normal function. The left ventricle has no regional  wall motion abnormalities. There is mild left ventricular hypertrophy.  Left ventricular diastolic parameters  are consistent with Grade I diastolic dysfunction (impaired relaxation).   2. Right ventricular systolic function is normal. The right ventricular  size is normal. There is normal pulmonary  artery systolic pressure.   3. The mitral valve is abnormal. Trivial mitral valve regurgitation. No  evidence of mitral stenosis. Moderate to severe mitral annular  calcification.   4. The aortic valve is tricuspid. Aortic valve regurgitation is mild.  Mild aortic valve stenosis. Aortic valve mean gradient measures 14.0 mmHg.  Aortic valve Vmax measures 2.45 m/s.   5. The inferior vena cava is normal in size with greater than 50%  respiratory variability, suggesting right atrial pressure of 3 mmHg.    Assessment and Plan   1. Palpitations - long history of palpitations - multiple prior monitors have only shown benign ectopy - increased symptoms, will increase her lopressor to 75mg  bid.  - EKG today shows NSR       2. HTN - elevated in clinic. Some ongoing orthostatic symptoms that may be medication related, she reported adequate hydration - may have to accept high bp's, will d/c chlorthalidone. Has been on isordil several years for chest paisn, change from tid to bid.    3..Hyperliidamia - LDL at goal, continue current meds   4. Aortic stenosis -mild by recent echo, repeat echo 3 years  F/u 6 weeks reassess symptoms     Antoine Poche, M.D.

## 2023-07-31 NOTE — Addendum Note (Signed)
Addended by: Lesle Chris on: 07/31/2023 11:56 AM   Modules accepted: Orders

## 2023-07-31 NOTE — Patient Instructions (Addendum)
Medication Instructions:   Increase Lopressor to 75mg  twice a day   Stop Chlorthalidone  Decrease Isordil (Isosorbide dinitrate) to twice a day   Continue all other medications.     Labwork:  none  Testing/Procedures:  none  Follow-Up:  6 weeks   Any Other Special Instructions Will Be Listed Below (If Applicable).   If you need a refill on your cardiac medications before your next appointment, please call your pharmacy.

## 2023-08-09 ENCOUNTER — Encounter: Payer: Self-pay | Admitting: *Deleted

## 2023-08-31 ENCOUNTER — Other Ambulatory Visit: Payer: Self-pay | Admitting: Cardiology

## 2023-09-05 ENCOUNTER — Other Ambulatory Visit: Payer: Self-pay | Admitting: Cardiology

## 2023-09-10 ENCOUNTER — Other Ambulatory Visit: Payer: Self-pay | Admitting: Cardiology

## 2023-09-12 ENCOUNTER — Ambulatory Visit: Payer: Medicare HMO | Admitting: Nurse Practitioner

## 2023-10-28 ENCOUNTER — Ambulatory Visit: Payer: Medicare HMO | Admitting: Nurse Practitioner

## 2023-10-29 ENCOUNTER — Encounter (HOSPITAL_COMMUNITY): Payer: Self-pay | Admitting: Nurse Practitioner

## 2023-11-07 ENCOUNTER — Ambulatory Visit: Payer: Medicare HMO | Admitting: Nurse Practitioner

## 2023-12-17 ENCOUNTER — Ambulatory Visit: Payer: Medicare HMO | Admitting: Nurse Practitioner

## 2023-12-31 ENCOUNTER — Other Ambulatory Visit (HOSPITAL_COMMUNITY): Payer: Self-pay | Admitting: Nurse Practitioner

## 2023-12-31 ENCOUNTER — Encounter (HOSPITAL_COMMUNITY): Payer: Self-pay | Admitting: Nurse Practitioner

## 2023-12-31 DIAGNOSIS — Z713 Dietary counseling and surveillance: Secondary | ICD-10-CM

## 2024-01-03 ENCOUNTER — Other Ambulatory Visit (HOSPITAL_COMMUNITY): Payer: Medicare HMO

## 2024-01-07 ENCOUNTER — Other Ambulatory Visit (HOSPITAL_COMMUNITY): Payer: Self-pay

## 2024-01-28 ENCOUNTER — Ambulatory Visit: Payer: Medicare HMO | Admitting: Nurse Practitioner

## 2024-02-10 ENCOUNTER — Other Ambulatory Visit: Payer: Self-pay | Admitting: Otolaryngology

## 2024-02-24 ENCOUNTER — Other Ambulatory Visit: Payer: Self-pay | Admitting: Cardiology

## 2024-03-05 ENCOUNTER — Ambulatory Visit: Admit: 2024-03-05 | Payer: Medicare PPO | Admitting: Otolaryngology

## 2024-03-05 SURGERY — EXCISION, MASS, NOSE
Anesthesia: General | Laterality: Right

## 2024-04-29 ENCOUNTER — Other Ambulatory Visit: Payer: Self-pay | Admitting: Cardiology

## 2024-06-11 ENCOUNTER — Other Ambulatory Visit: Payer: Self-pay | Admitting: Cardiology

## 2024-07-16 ENCOUNTER — Other Ambulatory Visit: Payer: Self-pay | Admitting: Cardiology

## 2024-08-07 ENCOUNTER — Encounter: Payer: Self-pay | Admitting: Nurse Practitioner

## 2024-08-07 ENCOUNTER — Ambulatory Visit: Attending: Nurse Practitioner | Admitting: Nurse Practitioner

## 2024-08-07 NOTE — Progress Notes (Deleted)
  Cardiology Office Note   Date:  08/07/2024  ID:  Tina Morgan, DOB [birth year], MRN [id number] PCP: Katrinka Aquas, MD  [facility] Providers Cardiologist:  Alvan Carrier, MD { Click to update primary MD,subspecialty MD or APP then REFRESH:1}    History of Present Illness Tina Morgan is a 73 y.o. female with a PMH of aortic valve stenosis, hypertension, hyperlipidemia, palpitations, past history of demand ischemia, OSA, who presents today for overdue follow-up.  Last seen by Dr. Alvan on July 31, 2023.  Patient did notice increased symptoms of palpitations, Lopressor was increased to 75 mg twice daily.  Blood pressure was elevated in clinic and noted some ongoing orthostatic symptoms that may be medication related.  Dr. Alvan stated that higher BPs may have to be excepted, chlorthalidone was DC'd.  Isordil was changed from 3 times daily to twice daily.  Recommended repeat echocardiogram for her mild aortic stenosis every 3 years.  Today she presents for overdue follow-up.  She states  ROS: ***  Studies Reviewed      *** Risk Assessment/Calculations {Does this patient have ATRIAL FIBRILLATION?:[phone]} No BP recorded.  {Refresh Note OR Click here to enter BP  :1}***       Physical Exam VS:  There were no vitals taken for this visit.       Wt Readings from Last 3 Encounters:  07/31/23 220 lb (99.8 kg)  01/21/23 226 lb 12.8 oz (102.9 kg)  07/05/22 228 lb 3.2 oz (103.5 kg)    GEN: Well nourished, well developed in no acute distress NECK: No JVD; No carotid bruits CARDIAC: ***RRR, no murmurs, rubs, gallops RESPIRATORY:  Clear to auscultation without rales, wheezing or rhonchi  ABDOMEN: Soft, non-tender, non-distended EXTREMITIES:  No edema; No deformity   ASSESSMENT AND PLAN ***    {Are you ordering a CV Procedure (e.g. stress test, cath, DCCV, TEE, etc)?   Press F2        :[id number]}  Dispo: ***  Signed, Almarie Crate, NP

## 2024-08-11 ENCOUNTER — Other Ambulatory Visit: Payer: Self-pay | Admitting: Cardiology

## 2024-09-18 ENCOUNTER — Other Ambulatory Visit: Payer: Self-pay | Admitting: Cardiology

## 2024-09-21 ENCOUNTER — Other Ambulatory Visit: Payer: Self-pay | Admitting: Cardiology

## 2024-09-23 NOTE — Telephone Encounter (Signed)
 Patient has not been seen in over a year and cancel numerous appointments. Will see if Dr. Court wants to have PCP refill.

## 2024-10-08 ENCOUNTER — Other Ambulatory Visit: Payer: Self-pay | Admitting: Cardiology

## 2024-10-14 ENCOUNTER — Other Ambulatory Visit: Payer: Self-pay | Admitting: Cardiology

## 2024-11-03 ENCOUNTER — Other Ambulatory Visit: Payer: Self-pay | Admitting: Cardiology

## 2024-11-04 NOTE — Telephone Encounter (Signed)
 Need office visit

## 2024-11-07 ENCOUNTER — Other Ambulatory Visit: Payer: Self-pay | Admitting: Cardiology

## 2024-11-10 NOTE — Telephone Encounter (Signed)
 Pt of Dr. Alvan. Passed her 3rd attempt and had either cancelled or No-Showed several appts. Does Dr. Alvan want to refill? Please advise.

## 2024-11-16 ENCOUNTER — Other Ambulatory Visit: Payer: Self-pay | Admitting: Cardiology

## 2024-11-16 NOTE — Telephone Encounter (Signed)
 Attempted to reach patient - mailbox is full.  Denied refill & suggested she call for appointment OR they may request from her pcp.

## 2024-11-20 ENCOUNTER — Other Ambulatory Visit: Payer: Self-pay | Admitting: Cardiology

## 2024-11-25 NOTE — Telephone Encounter (Signed)
 Pt of Dr. Alvan. Passed 3rd attempt. Does Dr. Alvan want to refill? Please advise.

## 2024-11-25 NOTE — Telephone Encounter (Signed)
 Denied - pharm may request from pcp.  Multiple attempts made to contact patient.

## 2024-12-08 ENCOUNTER — Other Ambulatory Visit: Payer: Self-pay | Admitting: Cardiology
